# Patient Record
Sex: Male | Born: 1960 | Race: White | Hispanic: No | Marital: Married | Smoking: Never smoker
Health system: Southern US, Community
[De-identification: ages and names within clinical notes are randomized; demographics above are authoritative.]

## PROBLEM LIST (undated history)

## (undated) DIAGNOSIS — G43909 Migraine, unspecified, not intractable, without status migrainosus: Secondary | ICD-10-CM

## (undated) DIAGNOSIS — E785 Hyperlipidemia, unspecified: Secondary | ICD-10-CM

## (undated) DIAGNOSIS — R011 Cardiac murmur, unspecified: Secondary | ICD-10-CM

## (undated) DIAGNOSIS — H539 Unspecified visual disturbance: Secondary | ICD-10-CM

## (undated) DIAGNOSIS — I10 Essential (primary) hypertension: Secondary | ICD-10-CM

## (undated) DIAGNOSIS — I340 Nonrheumatic mitral (valve) insufficiency: Secondary | ICD-10-CM

## (undated) HISTORY — PX: VASECTOMY: SHX75

## (undated) HISTORY — DX: Nonrheumatic mitral (valve) insufficiency: I34.0

## (undated) HISTORY — DX: Migraine, unspecified, not intractable, without status migrainosus: G43.909

## (undated) HISTORY — DX: Hyperlipidemia, unspecified: E78.5

## (undated) HISTORY — DX: Unspecified visual disturbance: H53.9

## (undated) HISTORY — PX: HERNIA REPAIR: SHX51

## (undated) HISTORY — DX: Cardiac murmur, unspecified: R01.1

## (undated) HISTORY — DX: Essential (primary) hypertension: I10

---

## 2005-12-13 ENCOUNTER — Ambulatory Visit: Payer: Self-pay | Admitting: Cardiology

## 2005-12-18 ENCOUNTER — Encounter: Payer: Self-pay | Admitting: Cardiovascular Disease

## 2005-12-18 ENCOUNTER — Ambulatory Visit: Payer: Self-pay | Admitting: Cardiology

## 2008-07-15 ENCOUNTER — Ambulatory Visit: Payer: Self-pay | Admitting: Internal Medicine

## 2008-10-13 ENCOUNTER — Ambulatory Visit: Payer: Self-pay | Admitting: Internal Medicine

## 2009-01-13 ENCOUNTER — Ambulatory Visit: Payer: Self-pay | Admitting: Internal Medicine

## 2009-04-06 ENCOUNTER — Ambulatory Visit: Payer: Self-pay | Admitting: Internal Medicine

## 2009-07-18 ENCOUNTER — Ambulatory Visit: Payer: Self-pay | Admitting: Internal Medicine

## 2009-07-18 ENCOUNTER — Encounter (INDEPENDENT_AMBULATORY_CARE_PROVIDER_SITE_OTHER): Payer: Self-pay | Admitting: *Deleted

## 2009-08-24 ENCOUNTER — Encounter (INDEPENDENT_AMBULATORY_CARE_PROVIDER_SITE_OTHER): Payer: Self-pay | Admitting: *Deleted

## 2009-08-28 ENCOUNTER — Ambulatory Visit: Payer: Self-pay | Admitting: Internal Medicine

## 2009-09-11 ENCOUNTER — Ambulatory Visit: Payer: Self-pay | Admitting: Internal Medicine

## 2009-09-11 HISTORY — PX: COLONOSCOPY: SHX174

## 2009-09-12 LAB — HM COLONOSCOPY

## 2010-01-18 ENCOUNTER — Ambulatory Visit: Payer: Self-pay | Admitting: Internal Medicine

## 2010-03-29 ENCOUNTER — Ambulatory Visit: Payer: Self-pay | Admitting: Internal Medicine

## 2010-04-06 ENCOUNTER — Ambulatory Visit: Payer: Self-pay | Admitting: Internal Medicine

## 2010-06-26 NOTE — Letter (Signed)
Summary: Previsit letter  [facility]  [address] [place], [place] [address]   Phone: [phone]  Fax: [phone]       07/18/2009 MRN: [id number]  Joel Coffey [address] [place], [place]  [address]  Dear Joel Coffey,  Welcome to the Gastroenterology Division at [facility].    You are scheduled to see a nurse for your pre-procedure visit on 08-11-09 at 10:00a.m. on the 3rd floor at [facility], [address]  We ask that you try to arrive at our office 15 minutes prior to your appointment time to allow for check-in.  Your nurse visit will consist of discussing your medical and surgical history, your immediate family medical history, and your medications.    Please bring a complete list of all your medications or, if you prefer, bring the medication bottles and we will list them.  We will need to be aware of both prescribed and over the counter drugs.  We will need to know exact dosage information as well.  If you are on blood thinners (Coumadin, Plavix, Aggrenox, Ticlid, etc.) please call our office today/prior to your appointment, as we need to consult with your physician about holding your medication.   Please be prepared to read and sign documents such as consent forms, a financial agreement, and acknowledgement forms.  If necessary, and with your consent, a friend or relative is welcome to sit-in on the nurse visit with you.  Please bring your insurance card so that we may make a copy of it.  If your insurance requires a referral to see a specialist, please bring your referral form from your primary care physician.  No co-pay is required for this nurse visit.     If you cannot keep your appointment, please call [phone] to cancel or reschedule prior to your appointment date.  This allows [place] the opportunity to schedule an appointment for another patient in need of care.    Thank you for choosing [facility] for your medical needs.   We appreciate the opportunity to care for you.  Please visit [place] at our website  to learn more about our practice.                     Sincerely.                                                                                                                   The Gastroenterology Division

## 2010-06-26 NOTE — Procedures (Signed)
Summary: Colonoscopy  Patient: Joel Coffey Note: All result statuses are Final unless otherwise noted.  Tests: (1) Colonoscopy (COL)   COL Colonoscopy           DONE     [facility]     [address]     [place], [place]  [address]           COLONOSCOPY PROCEDURE REPORT           PATIENT:  [name], [name]  MR#:  [id number]     BIRTHDATE:  08/01/1960, 48 yrs. old  GENDER:  male     ENDOSCOPIST:  Iva Boop, MD, [facility]     REF. BY:  Sharlet Salina, M.D.     PROCEDURE DATE:  09/11/2009     PROCEDURE:  Colonoscopy 19147     ASA CLASS:  Class I     INDICATIONS:  Routine Risk Screening     MEDICATIONS:   Fentanyl 75 mcg IV, Versed 7 mg IV           DESCRIPTION OF PROCEDURE:   After the risks benefits and     alternatives of the procedure were thoroughly explained, informed     consent was obtained.  Digital rectal exam was performed and     revealed no abnormalities and normal prostate.   The LB CF-[id number]     [id number] endoscope was introduced through the anus and advanced to     the cecum, which was identified by both the appendix and ileocecal     valve, without limitations.  The quality of the prep was     excellent, using MoviPrep.  The instrument was then slowly     withdrawn as the colon was fully examined.     Insertion: 2:07 minutes Withdrawal: 9:23 minutes     <<PROCEDUREIMAGES>>           FINDINGS:  A normal appearing cecum, ileocecal valve, and     appendiceal orifice were identified. The ascending, hepatic     flexure, transverse, splenic flexure, descending, sigmoid colon,     and rectum appeared unremarkable.   Retroflexed views in the     rectum revealed no abnormalities.    The scope was then withdrawn     from the patient and the procedure completed.           COMPLICATIONS:  None           ENDOSCOPIC IMPRESSION:     1) Normal colonoscopy, excellent prep           REPEAT EXAM:  In 10 year(s) for routine screening colonocsopy.           Iva Boop, MD, [facility]           CC:  Sharlet Salina, MD     The Patient           n.     eSIGNED:   Iva Boop at 09/11/2009 02:12 PM           Cheyenne Adas, [id number]  Note: An exclamation mark (!) indicates a result that was not dispersed into the flowsheet. Document Creation Date: 09/11/2009 2:12 PM _______________________________________________________________________  (1) Order result status: Final Collection or observation date-time: 09/11/2009 14:06 Requested date-time:  Receipt date-time:  Reported date-time:  Referring Physician:   Ordering Physician: Stan Head [id number]) Specimen Source:  Source: Launa Grill Order Number: [id number] Lab site:   Appended Document: Colonoscopy  Clinical Lists Changes  Observations: Added new observation of COLONNXTDUE: 08/2019 (09/11/2009 16:20)

## 2010-06-26 NOTE — Miscellaneous (Signed)
Summary: previsit  Clinical Lists Changes  Medications: Added new medication of MOVIPREP 100 GM  SOLR (PEG-KCL-NACL-NASULF-NA ASC-C) As directed - Signed Rx of MOVIPREP 100 GM  SOLR (PEG-KCL-NACL-NASULF-NA ASC-C) As directed;  #1 x 0;  Signed;  Entered by: Clide Cliff RN;  Authorized by: Iva Boop MD, [facility];  Method used: Electronically to [facility] [phone]*, [address], [address] [place], [place]  [address], Ph: [phone], Fax: [phone] Allergies: Added new allergy or adverse reaction of AUGMENTIN Observations: Added new observation of NKA: F (08/28/2009 13:15)    Prescriptions: MOVIPREP 100 GM  SOLR (PEG-KCL-NACL-NASULF-NA ASC-C) As directed  #1 x 0   Entered by:   Clide Cliff RN   Authorized by:   Iva Boop MD, [facility]   Signed by:   Clide Cliff RN on 08/28/2009   Method used:   Electronically to        [facility] [address] [phone]* (retail)       [address]       [address]       [place], [place]  [address]       Ph: [phone]       Fax: [phone]   RxID:   [id number]

## 2010-06-26 NOTE — Letter (Signed)
Summary: [facility] Instructions  [facility]  [address] [place], [place] [address]   Phone: [phone]  Fax: [phone]       Vitaly NIXON    [birth year]    MRN: [id number]        Procedure Day /Date:  Tuesday  [birth year]     Arrival Time:  10:30 am     Procedure Time:  11:30 am     Location of Procedure:                    _X _  [facility] (4th Floor)                        PREPARATION FOR COLONOSCOPY WITH MOVIPREP   Starting 5 days prior to your procedure _4/07/2011_ do not eat nuts, seeds, popcorn, corn, beans, peas,  salads, or any raw vegetables.  Do not take any fiber supplements (e.g. Metamucil, Citrucel, and Benefiber).  THE DAY BEFORE YOUR PROCEDURE         DATE:  09/04/2009   DAY:  Monday  1.  Drink clear liquids the entire day-NO SOLID FOOD  2.  Do not drink anything colored red or purple.  Avoid juices with pulp.  No orange juice.  3.  Drink at least 64 oz. (8 glasses) of fluid/clear liquids during the day to prevent dehydration and help the prep work efficiently.  CLEAR LIQUIDS INCLUDE: Water Jello Ice Popsicles Tea (sugar ok, no milk/cream) Powdered fruit flavored drinks Coffee (sugar ok, no milk/cream) Gatorade Juice: apple, white grape, white cranberry  Lemonade Clear bullion, consomm, broth Carbonated beverages (any kind) Strained chicken noodle soup Hard Candy                             4.  In the morning, mix first dose of MoviPrep solution:    Empty 1 Pouch A and 1 Pouch B into the disposable container    Add lukewarm drinking water to the top line of the container. Mix to dissolve    Refrigerate (mixed solution should be used within 24 hrs)  5.  Begin drinking the prep at 5:00 p.m. The MoviPrep container is divided by 4 marks.   Every 15 minutes drink the solution down to the next mark (approximately 8 oz) until the full liter is complete.   6.  Follow completed prep with 16 oz of clear liquid of your  choice (Nothing red or purple).  Continue to drink clear liquids until bedtime.  7.  Before going to bed, mix second dose of MoviPrep solution:    Empty 1 Pouch A and 1 Pouch B into the disposable container    Add lukewarm drinking water to the top line of the container. Mix to dissolve    Refrigerate  THE DAY OF YOUR PROCEDURE      DATE: _ 09/05/2009 _ DAY: _ Tuesday  Beginning at _ 6:30 _a.m. (5 hours before procedure):         1. Every 15 minutes, drink the solution down to the next mark (52 oz) until the full liter is complete.  2. Follow completed prep with 16 oz. of clear liquid of your choice.    3. You may drink clear liquids until _ 9:30 am _ (2 HOURS BEFORE PROCEDURE).   MEDICATION INSTRUCTIONS  Unless otherwise instructed, you should take regular prescription medications with a  small sip of water   as early as possible the morning of your procedure.           OTHER INSTRUCTIONS  You will need a responsible adult at least 52 years of age to accompany you and drive you home.   This person must remain in the waiting room during your procedure.  Wear loose fitting clothing that is easily removed.  Leave jewelry and other valuables at home.  However, you may wish to bring a book to read or  an iPod/MP3 player to listen to music as you wait for your procedure to start.  Remove all body piercing jewelry and leave at home.  Total time from sign-in until discharge is approximately 2-3 hours.  You should go home directly after your procedure and rest.  You can resume normal activities the  day after your procedure.  The day of your procedure you should not:   Drive   Make legal decisions   Operate machinery   Drink alcohol   Return to work  You will receive specific instructions about eating, activities and medications before you leave.    The above instructions have been reviewed and explained to me by   Clide Cliff,  RN_____________________    I fully understand and can verbalize these instructions _____________________________ Date _________

## 2010-07-24 ENCOUNTER — Other Ambulatory Visit: Payer: Self-pay | Admitting: Internal Medicine

## 2010-07-26 ENCOUNTER — Encounter (INDEPENDENT_AMBULATORY_CARE_PROVIDER_SITE_OTHER): Payer: Self-pay | Admitting: Internal Medicine

## 2010-07-26 DIAGNOSIS — E785 Hyperlipidemia, unspecified: Secondary | ICD-10-CM

## 2010-07-26 DIAGNOSIS — J309 Allergic rhinitis, unspecified: Secondary | ICD-10-CM

## 2010-07-26 DIAGNOSIS — E559 Vitamin D deficiency, unspecified: Secondary | ICD-10-CM

## 2010-08-02 ENCOUNTER — Ambulatory Visit
Admission: RE | Admit: 2010-08-02 | Discharge: 2010-08-02 | Disposition: A | Discharge status: Home / Self Care | Payer: BC Managed Care – PPO | Source: Ambulatory Visit | Attending: Internal Medicine | Admitting: Internal Medicine

## 2010-08-02 ENCOUNTER — Other Ambulatory Visit: Payer: Self-pay | Admitting: Internal Medicine

## 2010-10-12 NOTE — Assessment & Plan Note (Signed)
[facility]                              CARDIOLOGY OFFICE NOTE   Joel Coffey, Joel Coffey                          MRN:          [id number]  DATE:12/13/2005                            DOB:          March 13, 1961    I was asked by Dr. Lenord Fellers to evaluate Joel Coffey, a delightful 50 year old  married white male, father of two, for a history of mitral valve  regurgitation.   Apparently, his murmur intensity has changed.  He is totally asymptomatic  and runs about 12-15 miles a week.  He denies any shortness of breath,  palpitations, presyncope, syncope, or chest pain.   A 2D echocardiogram in [place], [place] showed mild mitral  regurgitation and mild pulmonary regurgitation.  There was no mention of  mitral valve disease or dysfunction otherwise.   Recent blood work was within normal limits.  His total cholesterol was 202,  HDL 56, LDL 133, triglycerides 67.   PAST MEDICAL HISTORY:  No known drug allergies.  He is on no meds.  He does  not smoke.  Drink:  He has about 1-2 alcoholic beverages a day.  He runs  about four times a week.   PAST SURGICAL HISTORY:  Hernia repair in 1985.   FAMILY HISTORY:  No premature history of coronary disease.   SOCIAL HISTORY:  He is a Programme researcher, broadcasting/film/video.  He is married and has two  children.   REVIEW OF SYSTEMS:  Negative other than the history of present illness.   PHYSICAL EXAMINATION:  VITAL SIGNS:  Blood pressure 115/72, pulse 64 and  regular.  His weight is 172.  He is 6 feet tall.  GENERAL:  Very pleasant.  SKIN:  Slightly pale.  HEENT:  Sclerae are clear.  Extraocular movements are intact.  PERRLA.  Facial asymmetry is normal.  Dentition satisfactory.  NECK:  Carotids are full without bruits.  There is no JVD.  Thyroid is not  enlarged.  Trachea is midline.  LUNGS:  Clear.  HEART:  Regular rate and rhythm without murmur, rub or gallop.  ABDOMEN:  Soft with good bowel sounds.  There is no  hepatomegaly.  EXTREMITIES:  No clubbing, cyanosis or edema.  Pulses are intact.  NEUROLOGIC:  Intact.   Careful auscultation of his heart revealed a soft murmur.  I could not  appreciate a click or any prolapse.  There was no S3 gallop.  There was no  S4.   ASSESSMENT:  Soft systolic murmur with a history of mild mitral  regurgitation and mild pulmonary regurgitation on a 2D echocardiogram in  1998.  He is totally asymptomatic.  I am not that impressed with his exam.  I suspect it has not changed.   PLAN:  1.  A 2D echocardiogram to reassess objectively the degree of regurgitation.  2.  I reviewed with the patient recent guidelines that do not recommend      endocarditis prophylaxis for mitral regurgitation alone.  Thomas C. Daleen Squibb, MD, [facility]    TCW/MedQ  DD:  12/13/2005  DT:  12/13/2005  Job #:  [id number]   cc:   Luanna Cole. Lenord Fellers, MD

## 2010-12-22 ENCOUNTER — Other Ambulatory Visit: Payer: Self-pay | Admitting: Internal Medicine

## 2011-02-07 ENCOUNTER — Ambulatory Visit: Payer: BC Managed Care – PPO | Admitting: Internal Medicine

## 2011-03-04 ENCOUNTER — Ambulatory Visit (INDEPENDENT_AMBULATORY_CARE_PROVIDER_SITE_OTHER): Payer: BC Managed Care – PPO | Admitting: Internal Medicine

## 2011-03-04 ENCOUNTER — Encounter: Payer: Self-pay | Admitting: Internal Medicine

## 2011-03-04 VITALS — BP 116/80 | HR 68 | Temp 98.3°F | Ht 73.0 in | Wt 203.0 lb

## 2011-03-04 DIAGNOSIS — E785 Hyperlipidemia, unspecified: Secondary | ICD-10-CM | POA: Insufficient documentation

## 2011-03-04 DIAGNOSIS — I340 Nonrheumatic mitral (valve) insufficiency: Secondary | ICD-10-CM

## 2011-03-04 DIAGNOSIS — E569 Vitamin deficiency, unspecified: Secondary | ICD-10-CM

## 2011-03-04 DIAGNOSIS — Z23 Encounter for immunization: Secondary | ICD-10-CM

## 2011-03-04 DIAGNOSIS — G43109 Migraine with aura, not intractable, without status migrainosus: Secondary | ICD-10-CM

## 2011-03-04 DIAGNOSIS — J309 Allergic rhinitis, unspecified: Secondary | ICD-10-CM | POA: Insufficient documentation

## 2011-03-04 DIAGNOSIS — E559 Vitamin D deficiency, unspecified: Secondary | ICD-10-CM | POA: Insufficient documentation

## 2011-03-04 DIAGNOSIS — I059 Rheumatic mitral valve disease, unspecified: Secondary | ICD-10-CM

## 2011-03-04 LAB — LIPID PANEL
Cholesterol: 197 mg/dL (ref 0–200)
HDL: 60 mg/dL (ref >39)
LDL Cholesterol: 112 mg/dL — ABNORMAL HIGH (ref 0–99)
Total CHOL/HDL Ratio: 3.3 Ratio
Triglycerides: 125 mg/dL (ref <150)
VLDL: 25 mg/dL (ref 0–40)

## 2011-03-04 LAB — HEPATIC FUNCTION PANEL
ALT: 60 U/L — ABNORMAL HIGH (ref 0–53)
AST: 43 U/L — ABNORMAL HIGH (ref 0–37)
Albumin: 4.6 g/dL (ref 3.5–5.2)
Alkaline Phosphatase: 88 U/L (ref 39–117)
Bilirubin, Direct: 0.2 mg/dL (ref 0.0–0.3)
Indirect Bilirubin: 0.4 mg/dL (ref 0.0–0.9)
Total Bilirubin: 0.6 mg/dL (ref 0.3–1.2)
Total Protein: 6.9 g/dL (ref 6.0–8.3)

## 2011-03-04 NOTE — Progress Notes (Signed)
  Subjective:    Patient ID: Joel Coffey, male    DOB: 1960-10-21, 50 y.o.   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 Lipid panel, liver functions, vitamin D level drawn and are pending. History of right tibia fracture around 50 years of age. History of right inguinal hernia in 1982. Vasectomy 2002. Tetanus immunization 2007. Colonoscopy April 2011. He is on generic Zocor 10 mg daily since May 2010.     Review of Systems     Objective:   Physical Exam neck no JVD thyromegaly or carotid bruits; chest clear to auscultation; cardiac exam regular rate and rhythm 2/6 short systolic ejection murmur heard along the left sternal border. Had 2-D echocardiogram @[facility]  in 2007 showing trivial mitral regurgitation.        Assessment & Plan:  Hyperlipidemia  Trivial mitral regurgitation  Allergic rhinitis  History of vitamin D deficiency  Plan is to review lab work lipid panel liver functions and vitamin D level. He will return in 6 months for physical exam. Influenza immunization administered today

## 2011-03-05 LAB — VITAMIN D 25 HYDROXY (VIT D DEFICIENCY, FRACTURES): Vit D, 25-Hydroxy: 44 ng/mL (ref 30–89)

## 2011-04-25 ENCOUNTER — Other Ambulatory Visit: Payer: Self-pay | Admitting: Internal Medicine

## 2011-07-15 ENCOUNTER — Other Ambulatory Visit: Payer: Self-pay | Admitting: Internal Medicine

## 2011-09-05 ENCOUNTER — Other Ambulatory Visit: Payer: BC Managed Care – PPO | Admitting: Internal Medicine

## 2011-09-05 DIAGNOSIS — Z0000 Encounter for general adult medical examination without abnormal findings: Secondary | ICD-10-CM

## 2011-09-05 LAB — COMPREHENSIVE METABOLIC PANEL
ALT: 34 U/L (ref 0–53)
AST: 33 U/L (ref 0–37)
Albumin: 4.3 g/dL (ref 3.5–5.2)
Alkaline Phosphatase: 73 U/L (ref 39–117)
BUN: 13 mg/dL (ref 6–23)
CO2: 28 mEq/L (ref 19–32)
Calcium: 9.3 mg/dL (ref 8.4–10.5)
Chloride: 104 mEq/L (ref 96–112)
Creat: 0.84 mg/dL (ref 0.50–1.35)
Glucose, Bld: 85 mg/dL (ref 70–99)
Potassium: 4.1 mEq/L (ref 3.5–5.3)
Sodium: 140 mEq/L (ref 135–145)
Total Bilirubin: 0.7 mg/dL (ref 0.3–1.2)
Total Protein: 6.9 g/dL (ref 6.0–8.3)

## 2011-09-05 LAB — CBC WITH DIFFERENTIAL/PLATELET
Basophils Absolute: 0.1 10*3/uL (ref 0.0–0.1)
Basophils Relative: 1 % (ref 0–1)
Eosinophils Absolute: 0.3 10*3/uL (ref 0.0–0.7)
Eosinophils Relative: 4 % (ref 0–5)
HCT: 45 % (ref 39.0–52.0)
Hemoglobin: 15.6 g/dL (ref 13.0–17.0)
Lymphocytes Relative: 26 % (ref 12–46)
Lymphs Abs: 1.7 10*3/uL (ref 0.7–4.0)
MCH: 30.2 pg (ref 26.0–34.0)
MCHC: 34.7 g/dL (ref 30.0–36.0)
MCV: 87.2 fL (ref 78.0–100.0)
Monocytes Absolute: 0.5 10*3/uL (ref 0.1–1.0)
Monocytes Relative: 8 % (ref 3–12)
Neutro Abs: 4 10*3/uL (ref 1.7–7.7)
Neutrophils Relative %: 61 % (ref 43–77)
Platelets: 208 10*3/uL (ref 150–400)
RBC: 5.16 MIL/uL (ref 4.22–5.81)
RDW: 12.8 % (ref 11.5–15.5)
WBC: 6.6 10*3/uL (ref 4.0–10.5)

## 2011-09-05 LAB — LIPID PANEL
Cholesterol: 194 mg/dL (ref 0–200)
HDL: 64 mg/dL (ref >39)
LDL Cholesterol: 117 mg/dL — ABNORMAL HIGH (ref 0–99)
Total CHOL/HDL Ratio: 3 Ratio
Triglycerides: 65 mg/dL (ref <150)
VLDL: 13 mg/dL (ref 0–40)

## 2011-09-06 ENCOUNTER — Encounter: Payer: Self-pay | Admitting: Internal Medicine

## 2011-09-06 ENCOUNTER — Ambulatory Visit (INDEPENDENT_AMBULATORY_CARE_PROVIDER_SITE_OTHER): Payer: BC Managed Care – PPO | Admitting: Internal Medicine

## 2011-09-06 VITALS — BP 118/86 | HR 68 | Temp 97.8°F | Ht 72.0 in | Wt 201.0 lb

## 2011-09-06 DIAGNOSIS — E785 Hyperlipidemia, unspecified: Secondary | ICD-10-CM

## 2011-09-06 DIAGNOSIS — I340 Nonrheumatic mitral (valve) insufficiency: Secondary | ICD-10-CM

## 2011-09-06 DIAGNOSIS — G43109 Migraine with aura, not intractable, without status migrainosus: Secondary | ICD-10-CM

## 2011-09-06 DIAGNOSIS — I059 Rheumatic mitral valve disease, unspecified: Secondary | ICD-10-CM

## 2011-09-06 DIAGNOSIS — Z0000 Encounter for general adult medical examination without abnormal findings: Secondary | ICD-10-CM

## 2011-09-06 DIAGNOSIS — J309 Allergic rhinitis, unspecified: Secondary | ICD-10-CM

## 2011-09-06 LAB — POCT URINALYSIS DIPSTICK
Bilirubin, UA: NEGATIVE
Blood, UA: NEGATIVE
Glucose, UA: NEGATIVE
Ketones, UA: NEGATIVE
Leukocytes, UA: NEGATIVE
Nitrite, UA: NEGATIVE
Protein, UA: NEGATIVE
Spec Grav, UA: 1.01
Urobilinogen, UA: NEGATIVE
pH, UA: 7

## 2011-09-06 LAB — PSA: PSA: 0.49 ng/mL (ref <=4.00)

## 2011-09-23 ENCOUNTER — Encounter: Payer: Self-pay | Admitting: Internal Medicine

## 2011-09-23 NOTE — Patient Instructions (Signed)
Return in 6 months for office visit lipid panel, liver functions, and influenza immunization. Continue same medications.

## 2011-09-23 NOTE — Progress Notes (Signed)
  Subjective:    Patient ID: Joel Coffey, male    DOB: 07/10/1960, 51 y.o.   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 History of mitral regurgitation asymptomatic. History of classical migraine headaches. History of vitamin D deficiency and hyperlipidemia. He is on Zocor generic 10 mg daily since 2010. Had colonoscopy April 2011. He fractured his right tibia when he was less than 76 years old. He is allergic to Augmentin causes a rash. History of right inguinal hernia repair 1982. Vasectomy 2002. History of allergic rhinitis. Allergy testing done 2009 showed very strong reactivity to multiple grass and tree pollens. Mild reactivity to selected molds, dust mite, cat dander and cockroach.    Review of Systems  Constitutional: Negative.   HENT: Positive for congestion and rhinorrhea.   Eyes: Negative.   Respiratory: Negative.   Cardiovascular:       History of asymptomatic mitral regurgitation seen by cardiologist  Genitourinary: Negative.   Musculoskeletal: Negative.   Neurological:       History of classical migraine headache  Hematological: Negative.   Psychiatric/Behavioral: Negative.        Objective:   Physical Exam  Vitals reviewed. Constitutional: He is oriented to person, place, and time. He appears well-developed and well-nourished.  HENT:  Head: Normocephalic and atraumatic.  Right Ear: External ear normal.  Left Ear: External ear normal.  Nose: Nose normal.  Mouth/Throat: Oropharynx is clear and moist.  Eyes: Conjunctivae and EOM are normal. Pupils are equal, round, and reactive to light.  Neck: Neck supple. No JVD present. No thyromegaly present.  Cardiovascular: Normal rate, regular rhythm and intact distal pulses.   Murmur heard.      2-3/6 systolic ejection murmur heard along the left sternal border  Pulmonary/Chest: Effort normal and breath sounds normal. He has no  wheezes. He has no rales.  Abdominal: Soft. Bowel sounds are normal. He exhibits no distension and no mass. There is no tenderness. There is no rebound and no guarding.  Genitourinary: Prostate normal.  Musculoskeletal: Normal range of motion. He exhibits no edema.  Lymphadenopathy:    He has no cervical adenopathy.  Neurological: He is alert and oriented to person, place, and time. He has normal reflexes. No cranial nerve deficit. Coordination normal.  Skin: Skin is warm and dry. No rash noted.  Psychiatric: He has a normal mood and affect. His behavior is normal. Judgment and thought content normal.          Assessment & Plan:  Hyperlipidemia  Asymptomatic mitral regurgitation  History of classical migraine headache  History of vitamin D deficiency  Allergic rhinitis  Plan: Patient is to return in 6 months for office visit lipid panel liver functions. That time he'll get influenza immunization. Lab work reviewed with him today is within normal limits with the exception of LDL cholesterol of 117. Total cholesterol is 194, HDL cholesterol is excellent at 64. Triglycerides are normal at 65.

## 2011-09-24 ENCOUNTER — Other Ambulatory Visit: Payer: Self-pay | Admitting: Internal Medicine

## 2011-11-06 ENCOUNTER — Other Ambulatory Visit: Payer: Self-pay | Admitting: Internal Medicine

## 2012-02-24 ENCOUNTER — Other Ambulatory Visit: Payer: Self-pay

## 2012-02-24 MED ORDER — IBUPROFEN 800 MG PO TABS: 800.0000 mg | ORAL_TABLET | Freq: Three times a day (TID) | ORAL | Status: DC | PRN

## 2012-03-09 ENCOUNTER — Other Ambulatory Visit: Payer: BC Managed Care – PPO | Admitting: Internal Medicine

## 2012-03-09 DIAGNOSIS — Z79899 Other long term (current) drug therapy: Secondary | ICD-10-CM

## 2012-03-09 DIAGNOSIS — E785 Hyperlipidemia, unspecified: Secondary | ICD-10-CM

## 2012-03-09 LAB — HEPATIC FUNCTION PANEL
ALT: 42 U/L (ref 0–53)
AST: 33 U/L (ref 0–37)
Albumin: 4.4 g/dL (ref 3.5–5.2)
Alkaline Phosphatase: 74 U/L (ref 39–117)
Bilirubin, Direct: 0.2 mg/dL (ref 0.0–0.3)
Indirect Bilirubin: 0.6 mg/dL (ref 0.0–0.9)
Total Bilirubin: 0.8 mg/dL (ref 0.3–1.2)
Total Protein: 6.8 g/dL (ref 6.0–8.3)

## 2012-03-09 LAB — LIPID PANEL
Cholesterol: 202 mg/dL — ABNORMAL HIGH (ref 0–200)
HDL: 60 mg/dL (ref >39)
LDL Cholesterol: 127 mg/dL — ABNORMAL HIGH (ref 0–99)
Total CHOL/HDL Ratio: 3.4 Ratio
Triglycerides: 73 mg/dL (ref <150)
VLDL: 15 mg/dL (ref 0–40)

## 2012-03-10 ENCOUNTER — Ambulatory Visit (INDEPENDENT_AMBULATORY_CARE_PROVIDER_SITE_OTHER): Payer: BC Managed Care – PPO | Admitting: Internal Medicine

## 2012-03-10 ENCOUNTER — Encounter: Payer: Self-pay | Admitting: Internal Medicine

## 2012-03-10 VITALS — BP 120/84 | HR 80 | Temp 97.8°F | Wt 196.0 lb

## 2012-03-10 DIAGNOSIS — Z23 Encounter for immunization: Secondary | ICD-10-CM

## 2012-03-10 DIAGNOSIS — E785 Hyperlipidemia, unspecified: Secondary | ICD-10-CM

## 2012-03-10 DIAGNOSIS — J309 Allergic rhinitis, unspecified: Secondary | ICD-10-CM

## 2012-03-10 NOTE — Progress Notes (Signed)
  Subjective:    Patient ID: Joel Coffey, male    DOB: December 12, 1960, 51 y.o.   MRN: [id number]  HPI 51 year old white male in today to followup on hyperlipidemia and allergic rhinitis. Allergy symptoms have been stable and has had no recent exacerbations this fall. Remains on statin medication. Reviewed lipid panel liver functions with him which are acceptable. Influenza immunization given.     Review of Systems     Objective:   Physical Exam skin is warm and dry. HEENT exam: TMs and pharynx are clear. Chest is clear. Cardiac exam regular rate and rhythm normal S1 and S2 murmur unchanged. Extremities without edema.        Assessment & Plan:  Hyperlipidemia  Allergic rhinitis  Plan: Influenza immunization given. Okay to refill statin medication for 6 months if pharmacy calls. Return in 6 months i.e. mid April for physical examination.

## 2012-03-10 NOTE — Patient Instructions (Addendum)
Physical exam due after 09/05/2012. Continue same medications and return at that time.

## 2012-09-07 ENCOUNTER — Other Ambulatory Visit: Payer: BC Managed Care – PPO | Admitting: Internal Medicine

## 2012-09-07 DIAGNOSIS — Z0000 Encounter for general adult medical examination without abnormal findings: Secondary | ICD-10-CM

## 2012-09-07 DIAGNOSIS — Z125 Encounter for screening for malignant neoplasm of prostate: Secondary | ICD-10-CM

## 2012-09-07 LAB — CBC WITH DIFFERENTIAL/PLATELET
Basophils Absolute: 0 10*3/uL (ref 0.0–0.1)
Basophils Relative: 1 % (ref 0–1)
Eosinophils Absolute: 0.2 10*3/uL (ref 0.0–0.7)
Eosinophils Relative: 3 % (ref 0–5)
HCT: 43.3 % (ref 39.0–52.0)
Hemoglobin: 15.3 g/dL (ref 13.0–17.0)
Lymphocytes Relative: 28 % (ref 12–46)
Lymphs Abs: 1.7 10*3/uL (ref 0.7–4.0)
MCH: 30.2 pg (ref 26.0–34.0)
MCHC: 35.3 g/dL (ref 30.0–36.0)
MCV: 85.4 fL (ref 78.0–100.0)
Monocytes Absolute: 0.5 10*3/uL (ref 0.1–1.0)
Monocytes Relative: 8 % (ref 3–12)
Neutro Abs: 3.7 10*3/uL (ref 1.7–7.7)
Neutrophils Relative %: 60 % (ref 43–77)
Platelets: 207 10*3/uL (ref 150–400)
RBC: 5.07 MIL/uL (ref 4.22–5.81)
RDW: 13.6 % (ref 11.5–15.5)
WBC: 6.1 10*3/uL (ref 4.0–10.5)

## 2012-09-07 LAB — COMPREHENSIVE METABOLIC PANEL
ALT: 33 U/L (ref 0–53)
AST: 35 U/L (ref 0–37)
Albumin: 4.3 g/dL (ref 3.5–5.2)
Alkaline Phosphatase: 67 U/L (ref 39–117)
BUN: 11 mg/dL (ref 6–23)
CO2: 26 mEq/L (ref 19–32)
Calcium: 9.3 mg/dL (ref 8.4–10.5)
Chloride: 103 mEq/L (ref 96–112)
Creat: 0.86 mg/dL (ref 0.50–1.35)
Glucose, Bld: 88 mg/dL (ref 70–99)
Potassium: 4.1 mEq/L (ref 3.5–5.3)
Sodium: 139 mEq/L (ref 135–145)
Total Bilirubin: 0.8 mg/dL (ref 0.3–1.2)
Total Protein: 6.8 g/dL (ref 6.0–8.3)

## 2012-09-07 LAB — LIPID PANEL
Cholesterol: 214 mg/dL — ABNORMAL HIGH (ref 0–200)
HDL: 65 mg/dL (ref >39)
LDL Cholesterol: 131 mg/dL — ABNORMAL HIGH (ref 0–99)
Total CHOL/HDL Ratio: 3.3 Ratio
Triglycerides: 88 mg/dL (ref <150)
VLDL: 18 mg/dL (ref 0–40)

## 2012-09-07 LAB — PSA: PSA: 0.71 ng/mL (ref <=4.00)

## 2012-09-08 ENCOUNTER — Encounter: Payer: Self-pay | Admitting: Internal Medicine

## 2012-09-08 ENCOUNTER — Ambulatory Visit (INDEPENDENT_AMBULATORY_CARE_PROVIDER_SITE_OTHER): Payer: BC Managed Care – PPO | Admitting: Internal Medicine

## 2012-09-08 VITALS — BP 132/88 | HR 68 | Ht 72.25 in | Wt 201.0 lb

## 2012-09-08 DIAGNOSIS — Z0000 Encounter for general adult medical examination without abnormal findings: Secondary | ICD-10-CM

## 2012-09-08 DIAGNOSIS — Z8669 Personal history of other diseases of the nervous system and sense organs: Secondary | ICD-10-CM

## 2012-09-08 DIAGNOSIS — I059 Rheumatic mitral valve disease, unspecified: Secondary | ICD-10-CM

## 2012-09-08 DIAGNOSIS — J309 Allergic rhinitis, unspecified: Secondary | ICD-10-CM

## 2012-09-08 DIAGNOSIS — I340 Nonrheumatic mitral (valve) insufficiency: Secondary | ICD-10-CM

## 2012-09-08 DIAGNOSIS — E785 Hyperlipidemia, unspecified: Secondary | ICD-10-CM

## 2012-09-08 LAB — POCT URINALYSIS DIPSTICK
Bilirubin, UA: NEGATIVE
Blood, UA: NEGATIVE
Glucose, UA: NEGATIVE
Ketones, UA: NEGATIVE
Leukocytes, UA: NEGATIVE
Nitrite, UA: NEGATIVE
Protein, UA: NEGATIVE
Spec Grav, UA: 1.015
Urobilinogen, UA: NEGATIVE
pH, UA: 5.5

## 2012-10-21 ENCOUNTER — Other Ambulatory Visit: Payer: Self-pay | Admitting: Internal Medicine

## 2013-01-18 ENCOUNTER — Other Ambulatory Visit: Payer: Self-pay | Admitting: Internal Medicine

## 2013-02-24 ENCOUNTER — Encounter: Payer: Self-pay | Admitting: Internal Medicine

## 2013-02-24 NOTE — Patient Instructions (Addendum)
Continued lipid-lowering medication and return in 6 months

## 2013-02-24 NOTE — Progress Notes (Signed)
Subjective:    Patient ID: Joel Coffey, male    DOB: [birth year], 52 y.o.   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 History of asymptomatic mitral regurgitation. History of classical migraine headaches. History of hyperlipidemia and vitamin D deficiency. He has been on Zocor generic 10 mg daily since 2010.  Past medical history: Had colonoscopy April 2011. Fractured his right tibia when he was less than 31 years old. History of right inguinal hernia repair 1982. Vasectomy 2002. History of allergic rhinitis. Allergy testing done in 2009 showed very strong reactivity to multiple grasses and tree pollens.. He had mild reactivity to selected molds, dust mite, cat dander and cockroach.  He apparently is not allergic to Augmentin. He went to allergist in [place] and had formal testing for penicillin allergy. Was found not to have penicillin allergy. Therefore he has no known drug allergies.  Old records indicate he had normal lab work in 2004 with the exception of an LDL cholesterol of 129 and a total cholesterol of 205. This was during an insurance examination.  He had an episode of scrotal pain in June 2004 and was treated for presumed epididymitis.  There are old records indicating he had possible irritable bowel syndrome treated with new they have.  His cardiac murmur dates back to the late 1990s. His doctor in [place] where he resided before moving to [place] noted in 2005 that she felt there have been no change in his cardiac murmur. However in 2006 she felt this murmur had changed a bit and was more intense. He has been told he does not need SBE prophylaxis. He was evaluated here by cardiologist 2007. Dr. Golden Hurter saw him and ordered a 2-D echocardiogram showing trivial mitral valvular regurgitation. No evidence of mitral stenosis or mitral valve prolapse. Overall left ventricular  systolic function was normal.  Social history: He is married and has 2 children a son and a daughter. Daughter is a Chief Strategy Officer in high school. Wife is a Sports administrator at [employer].  Family history:     Review of Systems  Constitutional: Negative.   HENT: Negative.   Eyes: Negative.   Respiratory: Negative.   Cardiovascular: Negative.   Gastrointestinal: Negative.   Endocrine: Negative.   Allergic/Immunologic: Positive for environmental allergies.  Hematological: Negative.   Psychiatric/Behavioral: Negative.        Objective:   Physical Exam  Constitutional: He is oriented to person, place, and time. He appears well-developed and well-nourished. No distress.  HENT:  Head: Normocephalic and atraumatic.  Right Ear: External ear normal.  Left Ear: External ear normal.  Mouth/Throat: Oropharynx is clear and moist. No oropharyngeal exudate.  Eyes: Conjunctivae and EOM are normal. Pupils are equal, round, and reactive to light. Right eye exhibits no discharge. Left eye exhibits no discharge. No scleral icterus.  Neck: Neck supple. No JVD present. No thyromegaly present.  Cardiovascular: Normal rate, regular rhythm and intact distal pulses.   Murmur heard. 2-3/6 systolic ejection murmur heard along left sternal border  Pulmonary/Chest: Effort normal and breath sounds normal. He has no wheezes. He has no rales.  Abdominal: Soft. Bowel sounds are normal. He exhibits no distension and no mass. There is no tenderness. There is no rebound and no guarding.  Genitourinary: Prostate normal.  Musculoskeletal: Normal range of motion. He exhibits no edema.  Lymphadenopathy:    He has no cervical adenopathy.  Neurological: He is alert and oriented to person, place, and time. He has normal reflexes. No cranial nerve deficit. Coordination normal.  Skin: Skin is warm and dry. No rash noted. He is not diaphoretic. No erythema.  Psychiatric: He has a normal mood and affect. His  behavior is normal. Judgment and thought content normal.          Assessment & Plan:  Hyperlipidemia-stable  Asymptomatic mitral regurgitation-unchanged  History of classical migraine headache  History of vitamin D deficiency  Allergic rhinitis-stable  Plan: Patient is to return in 6 months for office visit, lipid panel liver functions. At that time he'll get influenza immunization.

## 2013-03-11 ENCOUNTER — Other Ambulatory Visit: Payer: Self-pay | Admitting: Internal Medicine

## 2013-03-11 ENCOUNTER — Other Ambulatory Visit: Payer: BC Managed Care – PPO | Admitting: Internal Medicine

## 2013-03-11 DIAGNOSIS — E785 Hyperlipidemia, unspecified: Secondary | ICD-10-CM

## 2013-03-11 LAB — LIPID PANEL
Cholesterol: 165 mg/dL (ref 0–200)
HDL: 61 mg/dL (ref >39)
LDL Cholesterol: 90 mg/dL (ref 0–99)
Total CHOL/HDL Ratio: 2.7 Ratio
Triglycerides: 69 mg/dL (ref <150)
VLDL: 14 mg/dL (ref 0–40)

## 2013-03-12 ENCOUNTER — Ambulatory Visit: Payer: BC Managed Care – PPO | Admitting: Internal Medicine

## 2013-03-16 ENCOUNTER — Ambulatory Visit (INDEPENDENT_AMBULATORY_CARE_PROVIDER_SITE_OTHER): Payer: BC Managed Care – PPO | Admitting: Internal Medicine

## 2013-03-16 ENCOUNTER — Encounter: Payer: Self-pay | Admitting: Internal Medicine

## 2013-03-16 VITALS — BP 138/90 | HR 64 | Temp 97.7°F | Ht 72.0 in | Wt 200.0 lb

## 2013-03-16 DIAGNOSIS — Z23 Encounter for immunization: Secondary | ICD-10-CM

## 2013-03-16 DIAGNOSIS — E785 Hyperlipidemia, unspecified: Secondary | ICD-10-CM

## 2013-03-16 LAB — HEPATIC FUNCTION PANEL
ALT: 23 U/L (ref 0–53)
AST: 26 U/L (ref 0–37)
Albumin: 4.2 g/dL (ref 3.5–5.2)
Alkaline Phosphatase: 62 U/L (ref 39–117)
Bilirubin, Direct: 0.1 mg/dL (ref 0.0–0.3)
Indirect Bilirubin: 0.4 mg/dL (ref 0.0–0.9)
Total Bilirubin: 0.5 mg/dL (ref 0.3–1.2)
Total Protein: 6.7 g/dL (ref 6.0–8.3)

## 2013-03-16 NOTE — Progress Notes (Signed)
  Subjective:    Patient ID: Joel Coffey, male    DOB: 03/29/1961, 52 y.o.   MRN: [id number]  HPI 52 year old white male in today for six-month recheck on hyperlipidemia. He is on low-dose Zocor. No complaints or problems. Flu vaccine given today. Lipid panel is within normal limits on low-dose Zocor. His wife has been diagnosed with breast cancer and is undergoing chemotherapy. We talked about this today. It's been a bit stressful.    Review of Systems     Objective:   Physical Exam Chest clear to auscultation. Cardiac exam regular rate and rhythm. Murmur is unchanged. Extremities without edema. Skin is warm and dry.        Assessment & Plan:  Hyperlipidemia-stable on low-dose Zocor  Plan: Return in 6 months for physical exam. Flu vaccine given.

## 2013-03-16 NOTE — Patient Instructions (Addendum)
Continue same medication and return in 6 months for physical exam.

## 2013-05-13 ENCOUNTER — Other Ambulatory Visit: Payer: Self-pay | Admitting: Internal Medicine

## 2013-09-09 ENCOUNTER — Other Ambulatory Visit: Payer: BC Managed Care – PPO | Admitting: Internal Medicine

## 2013-09-09 DIAGNOSIS — E785 Hyperlipidemia, unspecified: Secondary | ICD-10-CM

## 2013-09-09 DIAGNOSIS — Z130 Encounter for screening for diseases of the blood and blood-forming organs and certain disorders involving the immune mechanism: Secondary | ICD-10-CM

## 2013-09-09 DIAGNOSIS — Z79899 Other long term (current) drug therapy: Secondary | ICD-10-CM

## 2013-09-09 DIAGNOSIS — Z125 Encounter for screening for malignant neoplasm of prostate: Secondary | ICD-10-CM

## 2013-09-09 LAB — CBC WITH DIFFERENTIAL/PLATELET
Basophils Absolute: 0.1 10*3/uL (ref 0.0–0.1)
Basophils Relative: 1 % (ref 0–1)
Eosinophils Absolute: 0.2 10*3/uL (ref 0.0–0.7)
Eosinophils Relative: 3 % (ref 0–5)
HCT: 43.3 % (ref 39.0–52.0)
Hemoglobin: 15.3 g/dL (ref 13.0–17.0)
Lymphocytes Relative: 26 % (ref 12–46)
Lymphs Abs: 1.5 10*3/uL (ref 0.7–4.0)
MCH: 31.1 pg (ref 26.0–34.0)
MCHC: 35.3 g/dL (ref 30.0–36.0)
MCV: 88 fL (ref 78.0–100.0)
Monocytes Absolute: 0.5 10*3/uL (ref 0.1–1.0)
Monocytes Relative: 8 % (ref 3–12)
Neutro Abs: 3.7 10*3/uL (ref 1.7–7.7)
Neutrophils Relative %: 62 % (ref 43–77)
Platelets: 206 10*3/uL (ref 150–400)
RBC: 4.92 MIL/uL (ref 4.22–5.81)
RDW: 13.3 % (ref 11.5–15.5)
WBC: 5.9 10*3/uL (ref 4.0–10.5)

## 2013-09-09 LAB — COMPREHENSIVE METABOLIC PANEL
ALT: 30 U/L (ref 0–53)
AST: 30 U/L (ref 0–37)
Albumin: 4.2 g/dL (ref 3.5–5.2)
Alkaline Phosphatase: 64 U/L (ref 39–117)
BUN: 11 mg/dL (ref 6–23)
CO2: 30 mEq/L (ref 19–32)
Calcium: 9.1 mg/dL (ref 8.4–10.5)
Chloride: 101 mEq/L (ref 96–112)
Creat: 0.76 mg/dL (ref 0.50–1.35)
Glucose, Bld: 95 mg/dL (ref 70–99)
Potassium: 4.3 mEq/L (ref 3.5–5.3)
Sodium: 135 mEq/L (ref 135–145)
Total Bilirubin: 0.7 mg/dL (ref 0.2–1.2)
Total Protein: 6.8 g/dL (ref 6.0–8.3)

## 2013-09-09 LAB — LIPID PANEL
Cholesterol: 178 mg/dL (ref 0–200)
HDL: 65 mg/dL (ref >39)
LDL Cholesterol: 100 mg/dL — ABNORMAL HIGH (ref 0–99)
Total CHOL/HDL Ratio: 2.7 Ratio
Triglycerides: 63 mg/dL (ref <150)
VLDL: 13 mg/dL (ref 0–40)

## 2013-09-10 ENCOUNTER — Encounter: Payer: Self-pay | Admitting: Internal Medicine

## 2013-09-10 ENCOUNTER — Ambulatory Visit (INDEPENDENT_AMBULATORY_CARE_PROVIDER_SITE_OTHER): Payer: BC Managed Care – PPO | Admitting: Internal Medicine

## 2013-09-10 VITALS — BP 124/78 | HR 72 | Temp 98.6°F | Ht 72.5 in | Wt 206.0 lb

## 2013-09-10 DIAGNOSIS — E785 Hyperlipidemia, unspecified: Secondary | ICD-10-CM

## 2013-09-10 DIAGNOSIS — J309 Allergic rhinitis, unspecified: Secondary | ICD-10-CM

## 2013-09-10 DIAGNOSIS — Z0000 Encounter for general adult medical examination without abnormal findings: Secondary | ICD-10-CM

## 2013-09-10 LAB — POCT URINALYSIS DIPSTICK
Bilirubin, UA: NEGATIVE
Blood, UA: NEGATIVE
Glucose, UA: NEGATIVE
Ketones, UA: NEGATIVE
Leukocytes, UA: NEGATIVE
Nitrite, UA: NEGATIVE
Protein, UA: NEGATIVE
Spec Grav, UA: <=1.005
Urobilinogen, UA: NEGATIVE
pH, UA: 7.5

## 2013-09-10 LAB — PSA: PSA: 0.52 ng/mL (ref <=4.00)

## 2013-09-10 NOTE — Progress Notes (Signed)
Subjective:    Patient ID: Joel Coffey, male    DOB: [birth year], 53 y.o.   MRN: [id number]  HPI  53 year old White male in today for health maintenance exam and they wish of medical issues. History of allergic rhinitis, migraine headaches, allergic rhinitis, mitral regurgitation, vitamin D deficiency. He is on Zocor 10 mg daily and takes daily Claritin. Seldom has headaches. Has been on Zocor since 2010.  No known drug allergies. There is a question in the past   of being allergic to Augmentin. He went to allergist in [place] and had formal testing for penicillin allergy. Was found NOT to have penicillin allergy.  Past medical history: Colonoscopy April 2011. Fractured right tibia when he was less than 73 years old. Right inguinal hernia repair 1982. Mastectomy 2002. Allergy testing done in 2009 showed very strong reactivity to multiple grasses and tree pollens. He had mild reactivity to selected molds, dust mite, cat dander and cockroach.   He had an episode of scrotal pain in 2004 and was treated for presumed epididymitis.  Records indicate he had normal lab work in 2004 with the exception of LDL cholesterol of 129 and total cholesterol of 205 during an insurance examination. Old records also indicate that he had possible irritable bowel syndrome treated with Nu-lev but this is not an issue at this time.  History of cardiac murmur dating back to late 1990s. He has been told he does not need SBE prophylaxis. Dr. Verl Blalock saw him in 2007, ordered a 2-D echocardiogram showing trivial mitral valvular regurgitation. No evidence of mitral stenosis or mitral valve prolapse. Overall left ventricular systolic function normal.  Social history: He is married and has 2 children, a son and a daughter. Daughter is a Equities trader in high school. Wife is a Sports coach school professor at [employer] and is being treated for breast cancer. Nonsmoker. Social alcohol consumption. Runs several days a week about 3 miles a  day.  Family history: Father died at age 68 of lymphoma. Mother living with history of skin cancer but generally well. 4 brothers in good health. No sisters.   Review of Systems  Constitutional: Negative.   Allergic/Immunologic: Positive for environmental allergies.  All other systems reviewed and are negative.      Objective:   Physical Exam  Vitals reviewed. Constitutional: He is oriented to person, place, and time. He appears well-developed and well-nourished. No distress.  HENT:  Head: Normocephalic and atraumatic.  Right Ear: External ear normal.  Left Ear: External ear normal.  Mouth/Throat: Oropharynx is clear and moist. No oropharyngeal exudate.  Eyes: Conjunctivae and EOM are normal. Pupils are equal, round, and reactive to light. Right eye exhibits no discharge. Left eye exhibits no discharge. No scleral icterus.  Neck: Neck supple. No JVD present. No thyromegaly present.  Cardiovascular: Normal rate and regular rhythm.   Murmur heard. Murmur unchanged from previous exam. 2/6 systolic ejection murmur heard along left sternal border  Pulmonary/Chest: Effort normal and breath sounds normal. No respiratory distress. He has no wheezes. He has no rales. He exhibits no tenderness.  Abdominal: Soft. Bowel sounds are normal. He exhibits no distension and no mass. There is no tenderness. There is no rebound and no guarding.  Genitourinary: Prostate normal.  Musculoskeletal: Normal range of motion. He exhibits no edema.  Lymphadenopathy:    He has no cervical adenopathy.  Neurological: He is alert and oriented to person, place, and time. He has normal reflexes. He displays normal reflexes. No cranial nerve  deficit. Coordination normal.  Skin: Skin is warm and dry. No rash noted. He is not diaphoretic.  Psychiatric: He has a normal mood and affect. His behavior is normal. Judgment and thought content normal.          Assessment & Plan:  Hyperlipidemia-stable on lipid-lowering  medication  Trivial mitral regurgitation with audible murmur-stable and no need for SBE prophylaxis  Allergic rhinitis-treated with Claritin over-the-counter  Plan: Medical problems are stable and he may return in one year or as needed. Continue statin medication and Claritin. He exercises regularly.

## 2013-09-10 NOTE — Patient Instructions (Signed)
Continue same medications and return in one year. Have flu shot in the Fall. It was a pleasure to see you today. Continue diet and exercise efforts.

## 2013-09-28 ENCOUNTER — Encounter: Payer: Self-pay | Admitting: Internal Medicine

## 2013-09-28 ENCOUNTER — Ambulatory Visit (INDEPENDENT_AMBULATORY_CARE_PROVIDER_SITE_OTHER): Payer: BC Managed Care – PPO | Admitting: Internal Medicine

## 2013-09-28 VITALS — BP 134/82 | Temp 98.5°F | Wt 206.0 lb

## 2013-09-28 DIAGNOSIS — S30860A Insect bite (nonvenomous) of lower back and pelvis, initial encounter: Secondary | ICD-10-CM

## 2013-09-28 DIAGNOSIS — S30861A Insect bite (nonvenomous) of abdominal wall, initial encounter: Secondary | ICD-10-CM

## 2013-09-28 DIAGNOSIS — W57XXXA Bitten or stung by nonvenomous insect and other nonvenomous arthropods, initial encounter: Secondary | ICD-10-CM

## 2013-09-28 MED ORDER — HYDROCORTISONE 2.5 % EX CREA: TOPICAL_CREAM | Freq: Two times a day (BID) | CUTANEOUS | Status: DC

## 2013-09-28 NOTE — Progress Notes (Signed)
   Subjective:    Patient ID: Joel Coffey, male    DOB: 16-Feb-1961, 53 y.o.   MRN: [id number]  HPI  Tick bite 5 or 6 days ago walking through a field in [place]. It was a small tic which was removed. Now has  rash in right groin that is itchy with a small lump. No fever no headache. No rash on palms or feet.    Review of Systems     Objective:   Physical Exam  Has petechial rash right groin with a small palpable nodule where tick attached.      Assessment & Plan:  Tick bite right groin  Plan: To 1/2% hydrocortisone cream apply to right groin twice daily for several days until symptoms resolve. Patient was reassured, if he develops symptoms he will recontact me.

## 2013-09-28 NOTE — Patient Instructions (Addendum)
Apply 2.5% hydrocortisone cream twice daily until healed. Call if symptoms develop such as headache or rash on palms or feet or fever

## 2013-11-29 ENCOUNTER — Other Ambulatory Visit: Payer: Self-pay | Admitting: Internal Medicine

## 2014-04-11 ENCOUNTER — Other Ambulatory Visit: Payer: Self-pay | Admitting: Internal Medicine

## 2014-07-20 ENCOUNTER — Other Ambulatory Visit: Payer: Self-pay | Admitting: Internal Medicine

## 2014-08-08 ENCOUNTER — Encounter: Payer: Self-pay | Admitting: Internal Medicine

## 2014-08-08 ENCOUNTER — Ambulatory Visit (INDEPENDENT_AMBULATORY_CARE_PROVIDER_SITE_OTHER): Payer: BLUE CROSS/BLUE SHIELD | Admitting: Internal Medicine

## 2014-08-08 VITALS — BP 130/84 | HR 83 | Temp 98.2°F | Ht 72.0 in | Wt 206.0 lb

## 2014-08-08 DIAGNOSIS — J069 Acute upper respiratory infection, unspecified: Secondary | ICD-10-CM | POA: Diagnosis not present

## 2014-08-08 MED ORDER — CLARITHROMYCIN 500 MG PO TABS: 500.0000 mg | ORAL_TABLET | Freq: Two times a day (BID) | ORAL | Status: DC

## 2014-08-08 NOTE — Patient Instructions (Addendum)
Take Biaxin 500 mg twice daily for 10 days.

## 2014-08-08 NOTE — Progress Notes (Signed)
   Subjective:    Patient ID: Joel Coffey, male    DOB: 12-07-1960, 54 y.o.   MRN: [id number]  HPI  Two week history of URI symptoms. Started out as sore throat and head congestion. He tried over-the-counter decongestants which did not help all that much. Now symptoms have moved into his chest. No fever or shaking chills. Coughing but no significant sputum production. No myalgias. Has been extremely tired. History of hyperlipidemia treated with statin medication. Hasn't felt like exercising.    Review of Systems     Objective:   Physical Exam  Skin: warm and dry. Nodes: none. TMs are clear. Pharynx is clear without exudate. Neck is supple. Chest: clear to auscultation without rales or wheezing.      Assessment & Plan:   Acute URI  Plan: Biaxin 500 mg twice daily for 10 days. Call if not better in 10 days or sooner if worse. Appointment made for physical examination April 2016. Last physical exam April 2015.

## 2014-09-13 ENCOUNTER — Other Ambulatory Visit: Payer: BLUE CROSS/BLUE SHIELD | Admitting: Internal Medicine

## 2014-09-15 ENCOUNTER — Encounter: Payer: BLUE CROSS/BLUE SHIELD | Admitting: Internal Medicine

## 2014-09-23 ENCOUNTER — Encounter (INDEPENDENT_AMBULATORY_CARE_PROVIDER_SITE_OTHER): Payer: Self-pay

## 2014-09-23 ENCOUNTER — Other Ambulatory Visit: Payer: BLUE CROSS/BLUE SHIELD | Admitting: Internal Medicine

## 2014-09-23 DIAGNOSIS — Z125 Encounter for screening for malignant neoplasm of prostate: Secondary | ICD-10-CM

## 2014-09-23 DIAGNOSIS — Z0000 Encounter for general adult medical examination without abnormal findings: Secondary | ICD-10-CM

## 2014-09-23 DIAGNOSIS — Z13220 Encounter for screening for lipoid disorders: Secondary | ICD-10-CM

## 2014-09-23 DIAGNOSIS — E559 Vitamin D deficiency, unspecified: Secondary | ICD-10-CM

## 2014-09-23 LAB — CBC WITH DIFFERENTIAL/PLATELET
Basophils Absolute: 0.1 10*3/uL (ref 0.0–0.1)
Basophils Relative: 1 % (ref 0–1)
Eosinophils Absolute: 0.2 10*3/uL (ref 0.0–0.7)
Eosinophils Relative: 3 % (ref 0–5)
HCT: 44.1 % (ref 39.0–52.0)
Hemoglobin: 15.5 g/dL (ref 13.0–17.0)
Lymphocytes Relative: 22 % (ref 12–46)
Lymphs Abs: 1.4 10*3/uL (ref 0.7–4.0)
MCH: 30.8 pg (ref 26.0–34.0)
MCHC: 35.1 g/dL (ref 30.0–36.0)
MCV: 87.5 fL (ref 78.0–100.0)
MPV: 9.3 fL (ref 8.6–12.4)
Monocytes Absolute: 0.5 10*3/uL (ref 0.1–1.0)
Monocytes Relative: 7 % (ref 3–12)
Neutro Abs: 4.4 10*3/uL (ref 1.7–7.7)
Neutrophils Relative %: 67 % (ref 43–77)
Platelets: 201 10*3/uL (ref 150–400)
RBC: 5.04 MIL/uL (ref 4.22–5.81)
RDW: 13.5 % (ref 11.5–15.5)
WBC: 6.5 10*3/uL (ref 4.0–10.5)

## 2014-09-23 LAB — COMPLETE METABOLIC PANEL WITH GFR
ALT: 34 U/L (ref 0–53)
AST: 31 U/L (ref 0–37)
Albumin: 4.1 g/dL (ref 3.5–5.2)
Alkaline Phosphatase: 71 U/L (ref 39–117)
BUN: 11 mg/dL (ref 6–23)
CO2: 21 mEq/L (ref 19–32)
Calcium: 9 mg/dL (ref 8.4–10.5)
Chloride: 101 mEq/L (ref 96–112)
Creat: 0.82 mg/dL (ref 0.50–1.35)
GFR, Est African American: >89 mL/min
GFR, Est Non African American: >89 mL/min
Glucose, Bld: 89 mg/dL (ref 70–99)
Potassium: 4.4 mEq/L (ref 3.5–5.3)
Sodium: 136 mEq/L (ref 135–145)
Total Bilirubin: 0.8 mg/dL (ref 0.2–1.2)
Total Protein: 6.8 g/dL (ref 6.0–8.3)

## 2014-09-23 LAB — LIPID PANEL
Cholesterol: 186 mg/dL (ref 0–200)
HDL: 62 mg/dL (ref >=40)
LDL Cholesterol: 109 mg/dL — ABNORMAL HIGH (ref 0–99)
Total CHOL/HDL Ratio: 3 Ratio
Triglycerides: 76 mg/dL (ref <150)
VLDL: 15 mg/dL (ref 0–40)

## 2014-09-24 LAB — PSA: PSA: 0.55 ng/mL (ref <=4.00)

## 2014-09-24 LAB — VITAMIN D 25 HYDROXY (VIT D DEFICIENCY, FRACTURES): Vit D, 25-Hydroxy: 23 ng/mL — ABNORMAL LOW (ref 30–100)

## 2014-09-26 ENCOUNTER — Encounter: Payer: Self-pay | Admitting: Internal Medicine

## 2014-09-26 ENCOUNTER — Ambulatory Visit (INDEPENDENT_AMBULATORY_CARE_PROVIDER_SITE_OTHER): Payer: BLUE CROSS/BLUE SHIELD | Admitting: Internal Medicine

## 2014-09-26 VITALS — BP 110/80 | HR 77 | Temp 98.0°F | Ht 72.0 in | Wt 206.0 lb

## 2014-09-26 DIAGNOSIS — I340 Nonrheumatic mitral (valve) insufficiency: Secondary | ICD-10-CM

## 2014-09-26 DIAGNOSIS — E785 Hyperlipidemia, unspecified: Secondary | ICD-10-CM

## 2014-09-26 DIAGNOSIS — J309 Allergic rhinitis, unspecified: Secondary | ICD-10-CM | POA: Diagnosis not present

## 2014-09-26 DIAGNOSIS — Z0000 Encounter for general adult medical examination without abnormal findings: Secondary | ICD-10-CM | POA: Diagnosis not present

## 2014-10-05 NOTE — Patient Instructions (Signed)
Continue same medications and return in one year. It was a pleasure to see you today.

## 2014-10-05 NOTE — Progress Notes (Signed)
Subjective:    Patient ID: Joel Coffey, male    DOB: Mar 08, 1961, 54 y.o.   MRN: [id number]  HPI  54 year old White male in today for health maintenance exam and evaluation of medical issues. Had colonoscopy 2011 with repeat study due in 2021. History of hyperlipidemia, allergic rhinitis, migraine headaches, mitral regurgitation, history of vitamin D deficiency. Seldom has headaches. Has been on Zocor since 2010.  No known drug allergies. At one point there was a question of Augmentin allergy. He subsequently went to allergist and plan: Had formal testing for penicillin allergy. He was found NOT to have penicillin allergy.  Past medical history: Fractured right tibia when he was less than 46 years old. Right inguinal hernia repair 1982. Mastectomy 2002. Allergy testing done in 2009 showed very strong reactivity to multiple grasses and tree pollens. He had mild reactivity to selected molds, dust mite, cat dander and cockroach.  Had episode of scrotal pain in 2004 and was treated for presumed epididymitis.  Records indicate he had normal lab work in 2004 with the exception of LDL cholesterol of 129 and total cholesterol of 205 during an insurance examination.  Old records also indicate he had possible irritable bowel syndrome treated with Nu-Lev but currently this is not an issue.  History of cardiac murmur dating back to the late 1990s. He has been told he does not need SBE prophylaxis. Dr. Orrin Brigham saw him in 2007, ordered a 2-D echocardiogram showing trivial mitral valvular regurgitation. No evidence of mitral stenosis or mitral valve prolapse. Overall, left ventricular systolic function was normal.  Social history: He's married and has 2 children, a son and a daughter. 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 Daughter attends [employer] and is finishing her Freshman year there. Wife is a Engineer, materials at [employer]. Wife has history of breast  cancer. Patient is a nonsmoker. Social alcohol consumption. He runs several days a week about 3 miles at a time.  Family history: Father died at age 73 of lymphoma. Mother living with history of skin cancer but generally in good health. 4 brothers in good health. No sisters.    Review of Systems  Constitutional: Negative.   Allergic/Immunologic: Positive for environmental allergies.  All other systems reviewed and are negative.      Objective:   Physical Exam  Constitutional: He is oriented to person, place, and time. He appears well-developed and well-nourished. No distress.  HENT:  Head: Normocephalic and atraumatic.  Right Ear: External ear normal.  Left Ear: External ear normal.  Mouth/Throat: Oropharynx is clear and moist. No oropharyngeal exudate.  Eyes: Conjunctivae and EOM are normal. Pupils are equal, round, and reactive to light. Right eye exhibits no discharge. Left eye exhibits no discharge. No scleral icterus.  Neck: Neck supple. No JVD present. No thyromegaly present.  Cardiovascular: Normal rate and regular rhythm.   Murmur heard. 2/6 systolic ejection murmur unchanged. It is heard along the left sternal border.  Pulmonary/Chest: Effort normal and breath sounds normal.  Abdominal: Soft. Bowel sounds are normal. He exhibits no distension and no mass. There is no tenderness. There is no rebound and no guarding.  Genitourinary: Prostate normal.  Musculoskeletal: Normal range of motion. He exhibits no edema.  Lymphadenopathy:    He has no cervical adenopathy.  Neurological: He is alert and oriented to person, place, and time. He has normal reflexes. No cranial nerve deficit. Coordination normal.  Skin: Skin is warm and dry. No  rash noted. He is not diaphoretic.  Psychiatric: He has a normal mood and affect. His behavior is normal. Judgment and thought content normal.  Vitals reviewed.         Assessment & Plan:  Hyperlipidemia-lipids normal on lipid-lowering  medication. Continue same dose and return in one year.  History of allergic rhinitis treated with over-the-counter antihistamine  Trivial mitral regurgitation with audible murmur-stable and no need for SBE prophylaxis  Plan: Return in one year or as needed. Medical problems are stable. He exercises regularly. Continue statin medication.

## 2014-11-21 ENCOUNTER — Encounter: Payer: Self-pay | Admitting: Internal Medicine

## 2014-11-21 ENCOUNTER — Ambulatory Visit (INDEPENDENT_AMBULATORY_CARE_PROVIDER_SITE_OTHER): Payer: BLUE CROSS/BLUE SHIELD | Admitting: Internal Medicine

## 2014-11-21 VITALS — BP 128/84 | HR 78 | Temp 98.0°F | Wt 211.0 lb

## 2014-11-21 DIAGNOSIS — K2289 Other specified disease of esophagus: Secondary | ICD-10-CM

## 2014-11-21 DIAGNOSIS — K228 Other specified diseases of esophagus: Secondary | ICD-10-CM

## 2014-11-21 DIAGNOSIS — K224 Dyskinesia of esophagus: Secondary | ICD-10-CM

## 2014-11-21 NOTE — Progress Notes (Signed)
   Subjective:    Patient ID: Joel Coffey, male    DOB: [birth year], 54 y.o.   MRN: [id number]  HPI Patient says it recently several times a month generally when he is eating dinner that he's experienced an issue with pain in his chest while swallowing food. Doesn't have to be any particular food. If he relaxes for a moment, the pain will subside. He's concerned about this because he said his brother died of a ruptured esophagus but doesn't know why brother had such a condition. Patient thinks it may be an anxiety component to this. Not sure if he has any reflux symptoms but thinks he might. Says episodes do not usually occur at lunch or breakfast.    Review of Systems no abdominal pain. No change in bowel movements.     Objective:   Physical Exam  No hepatosplenomegaly or masses. No tenderness in abdomen.      Assessment & Plan:  Probable esophageal spasm  Possible GE reflux  ? Anxiety  Plan: Patient will try Nexium 24 over-the-counter on a daily basis. He is to have a barium swallow to see if he has a stricture, ring or web contributing to the symptoms. Since it doesn't happen very often, he may not want to be on chronic daily medication for this. Discussed treatment options with him including calcium channel blockers and try cyclic antidepressives if indeed this turns out to be esophageal spasm. He will go later this week for barium swallow.

## 2014-11-21 NOTE — Patient Instructions (Signed)
Take Nexium 24 daily. Have barium swallow this week.

## 2014-11-23 ENCOUNTER — Other Ambulatory Visit: Payer: Self-pay

## 2014-11-23 ENCOUNTER — Other Ambulatory Visit: Payer: Self-pay | Admitting: Internal Medicine

## 2014-12-07 ENCOUNTER — Telehealth: Payer: Self-pay | Admitting: *Deleted

## 2014-12-07 ENCOUNTER — Ambulatory Visit
Admission: RE | Admit: 2014-12-07 | Discharge: 2014-12-07 | Disposition: A | Discharge status: Home / Self Care | Payer: BLUE CROSS/BLUE SHIELD | Source: Ambulatory Visit | Attending: Internal Medicine | Admitting: Internal Medicine

## 2014-12-07 DIAGNOSIS — K228 Other specified diseases of esophagus: Secondary | ICD-10-CM

## 2014-12-07 DIAGNOSIS — K2289 Other specified disease of esophagus: Secondary | ICD-10-CM

## 2014-12-07 MED ORDER — PANTOPRAZOLE SODIUM 40 MG PO TBEC: 40.0000 mg | DELAYED_RELEASE_TABLET | Freq: Every day | ORAL | Status: DC

## 2014-12-07 NOTE — Telephone Encounter (Signed)
Reviewed xray results with patient .script sent to patient pharmacy for protonix

## 2015-01-14 ENCOUNTER — Other Ambulatory Visit: Payer: Self-pay | Admitting: Internal Medicine

## 2015-02-08 ENCOUNTER — Other Ambulatory Visit: Payer: Self-pay | Admitting: Internal Medicine

## 2015-04-22 ENCOUNTER — Other Ambulatory Visit: Payer: Self-pay | Admitting: Internal Medicine

## 2015-08-06 ENCOUNTER — Ambulatory Visit (INDEPENDENT_AMBULATORY_CARE_PROVIDER_SITE_OTHER): Payer: BLUE CROSS/BLUE SHIELD | Admitting: Family Medicine

## 2015-08-06 VITALS — BP 128/82 | HR 82 | Temp 98.4°F | Resp 17 | Ht 72.0 in | Wt 208.0 lb

## 2015-08-06 DIAGNOSIS — A46 Erysipelas: Secondary | ICD-10-CM

## 2015-08-06 MED ORDER — DOXYCYCLINE HYCLATE 100 MG PO CAPS: 100.0000 mg | ORAL_CAPSULE | Freq: Two times a day (BID) | ORAL | Status: DC

## 2015-08-06 MED ORDER — CEFTRIAXONE SODIUM 1 G IJ SOLR
1.0000 g | Freq: Once | INTRAMUSCULAR | Status: AC
  Administered 2015-08-06: 1 g via INTRAMUSCULAR

## 2015-08-06 NOTE — Patient Instructions (Addendum)
IF you received an x-ray today, you will receive an invoice from [facility]. Please contact [facility] at [phone] with questions or concerns regarding your invoice.   IF you received labwork today, you will receive an invoice from [employer]. Please contact [facility] at [phone] with questions or concerns regarding your invoice.   Our billing staff will not be able to assist you with questions regarding bills from these companies.  You will be contacted with the lab results as soon as they are available. The fastest way to get your results is to activate your My Chart account. Instructions are located on the last page of this paperwork. If you have not heard from [place] regarding the results in 2 weeks, please contact this office.  Erysipelas Erysipelas is an infection that affects the skin and the tissues that are near the surface of the skin. It causes the skin to become red, swollen, and painful. The infection is most common on the legs but may also affect other areas, such as the face. With treatment, the infection usually goes away in a few days. If not treated, the infection can spread or lead to other problems, such as abscesses. CAUSES Erysipelas is caused by bacteria. Most often, it is caused by bacteria called streptococci. The bacteria often enter through a break in the skin, such as a cut, surgical incision, burn, insect bite, open sore, or crack in the skin. Sometimes the source where the bacteria entered is not known. RISK FACTORS Some people are at an increased risk for developing erysipelas, including:  Young children.  Elderly people.  People with a weakened body defense system (immune system), such as people with HIV or AIDS.  People who have diabetes.  People who drink too much alcohol.  People who have had recent surgery.  People with yeast infections of the skin.  People who have swollen legs. SIGNS AND  SYMPTOMS The infection causes a reddened area on the skin. This reddened area may:  Be painful and swollen.  Have a distinct border around it.  Feel itchy and hot.  Develop blisters. Other symptoms may include:  Fever.  Chills.  Nausea and vomiting.  Swollen glands (lymph nodes).  Headache.  Fatigue.  Loss of appetite. DIAGNOSIS Your health care provider will take your medical history and do a physical exam. He or she will usually be able to diagnose erysipelas by closely examining your skin. TREATMENT Erysipelas can usually be treated effectively with antibiotic medicines. The infection usually gets better within a few days of treatment. HOME CARE INSTRUCTIONS  Take medicines only as directed by your health care provider.  Take your antibiotic medicine as directed by your health care provider. Finish the antibiotic even if you start to feel better.  If the skin infection is on your leg or arm, elevate the leg or arm to help reduce swelling.  Do not put creams or lotions on the affected area of your skin unless your health care provider instructs you to do that.  Do not share bedding, towels, or washcloths (linens) with other people. Using only your own linens will help to prevent the infection from spreading to others.  Keep all follow-up visits as directed by your health care provider. This is important. SEEK MEDICAL CARE IF:  You have pain or discomfort that is not controlled by medicines.  Your red area of skin gets larger or turns dark in color.  Your skin infection returns in the same area or  appears in another area. SEEK IMMEDIATE MEDICAL CARE IF:  Your fever is getting worse.  Your feelings of illness are getting worse.  You notice red streaks coming from the infected area.   This information is not intended to replace advice given to you by your health care provider. Make sure you discuss any questions you have with your health care provider.    Document Released: 02/05/2001 Document Revised: 06/03/2014 Document Reviewed: 12/27/2013 Elsevier Interactive Patient Education [employer].

## 2015-08-06 NOTE — Progress Notes (Signed)
Subjective:    Patient ID: Joel Coffey, male    DOB: [birth year], 55 y.o.   MRN: [id number] By signing my name below, I, Judithe Modest, attest that this documentation has been prepared under the direction and in the presence of Delman Cheadle, MD. Electronically Signed: Judithe Modest, ER Scribe. 08/06/2015. 11:00 AM.  Chief Complaint  Patient presents with  . Facial Swelling    under right eye noticed redness on Friday    HPI HPI Comments: Joel Coffey is a 55 y.o. male who presents to [facility] complaining of a red rash on the right side of his face. He denies fever or chills. He denies eye pain. He denies URI sx. He denies gastrointestinal sx. He went to the gym two days ago, and later that day he noted some mild irritation on the right side of his face. The following morning the irritation had progressed and he has some slight swelling and redness. This morning he woke up and the swelling and redness had significantly progressed, increasing both in the size of swelling and the area of redness.    Past Medical History  Diagnosis Date  . Hyperlipidemia   . Mitral regurgitation   . Migraine headache   . Vitamin D deficiency    Allergies  Allergen Reactions  . Amoxicillin-Pot Clavulanate     REACTION: hives   Current Outpatient Prescriptions on File Prior to Visit  Medication Sig Dispense Refill  . ibuprofen (ADVIL,MOTRIN) 800 MG tablet TAKE 1 TABLET BY MOUTH EVERY 8 HOURS AS NEEDED FOR PAIN 90 tablet 3  . loratadine (CLARITIN) 10 MG tablet Take 10 mg by mouth daily.    . Multiple Vitamins-Minerals (MULTIVITAMIN,TX-MINERALS) tablet Take 1 tablet by mouth daily.      . pantoprazole (PROTONIX) 40 MG tablet TAKE 1 TABLET(40 MG) BY MOUTH DAILY 30 tablet 11  . simvastatin (ZOCOR) 10 MG tablet TAKE 1 TABLET BY MOUTH EVERY DAY 30 tablet 11   No current facility-administered medications on file prior to visit.    Review of Systems  Constitutional: Negative for fever, chills, activity change  and appetite change.  HENT: Positive for congestion, facial swelling and sinus pressure. Negative for ear pain and trouble swallowing.   Eyes: Negative for pain, discharge, redness and visual disturbance.  Respiratory: Negative for cough and chest tightness.   Musculoskeletal: Negative for myalgias.  Skin: Positive for color change and rash.  Neurological: Positive for facial asymmetry. Negative for numbness.  Hematological: Negative for adenopathy.      Objective:  BP 128/82 mmHg  Pulse 82  Temp(Src) 98.4 F (36.9 C) (Oral)  Resp 17  Ht 6' (1.829 m)  Wt 208 lb (94.348 kg)  BMI 28.20 kg/m2  SpO2 97%  Physical Exam  Constitutional: He is oriented to person, place, and time. He appears well-developed and well-nourished. No distress.  HENT:  Head: Normocephalic and atraumatic.  Eyes: Pupils are equal, round, and reactive to light.  Neck: Neck supple.  Cardiovascular: Normal rate.   Pulmonary/Chest: Effort normal. No respiratory distress.  Musculoskeletal: Normal range of motion.  Neurological: He is alert and oriented to person, place, and time. Coordination normal.  Skin: Skin is warm and dry. He is not diaphoretic.  Two 3m cm areas of nodular induration on the right cheek with the upper causing wider induration and edema spreading to orbital rim and canthus.  Psychiatric: He has a normal mood and affect. His behavior is normal.  Nursing note and vitals reviewed.  Assessment & Plan:   1. Erysipelas   RTC if worsening or no improvement in 2d. Warned of emergency if develops any orbital sxs or signs of systemic illness.  Meds ordered this encounter  Medications  . cefTRIAXone (ROCEPHIN) injection 1 g    Sig:     Order Specific Question:  Antibiotic Indication:    Answer:  Cellulitis  . doxycycline (VIBRAMYCIN) 100 MG capsule    Sig: Take 1 capsule (100 mg total) by mouth 2 (two) times daily.    Dispense:  20 capsule    Refill:  0    I personally performed the  services described in this documentation, which was scribed in my presence. The recorded information has been reviewed and considered, and addended by me as needed.  Delman Cheadle, MD MPH

## 2015-08-09 ENCOUNTER — Telehealth: Payer: Self-pay | Admitting: Internal Medicine

## 2015-08-09 ENCOUNTER — Ambulatory Visit (INDEPENDENT_AMBULATORY_CARE_PROVIDER_SITE_OTHER): Payer: BLUE CROSS/BLUE SHIELD | Admitting: Internal Medicine

## 2015-08-09 ENCOUNTER — Encounter: Payer: Self-pay | Admitting: Internal Medicine

## 2015-08-09 VITALS — BP 126/74 | HR 84 | Temp 97.7°F | Resp 20 | Ht 72.0 in | Wt 210.0 lb

## 2015-08-09 DIAGNOSIS — B029 Zoster without complications: Secondary | ICD-10-CM

## 2015-08-09 DIAGNOSIS — L989 Disorder of the skin and subcutaneous tissue, unspecified: Secondary | ICD-10-CM

## 2015-08-09 MED ORDER — VALACYCLOVIR HCL 1 G PO TABS: 1000.0000 mg | ORAL_TABLET | Freq: Three times a day (TID) | ORAL | Status: DC

## 2015-08-09 MED ORDER — DOXYCYCLINE HYCLATE 100 MG PO CAPS: 100.0000 mg | ORAL_CAPSULE | Freq: Two times a day (BID) | ORAL | Status: DC

## 2015-08-09 NOTE — Progress Notes (Signed)
   Subjective:    Patient ID: Joel Coffey, male    DOB: [birth year], 55 y.o.   MRN: [id number]  HPI Patient is a healthy 55 year old married Male who went to the gym on Thursday, March 9 and subsequently noticed a red spot on his right face near his zygomatic arch. Subsequently developed a second lesion below that. Has some erythema and swelling under his right eye, Also noticed his scalp was tender. He went to [facility] on [address] Sunday March 12. Was seen by Dr. Brigitte Pulse. Was thought to have cellulitis perhaps due to MRSA and was  prescribed doxycycline for 10 days. Has continued to develop new lesions. Has another lesion near his lip. Has been compliant with twice a day doxycycline therapy.    Review of Systems     Objective:   Physical Exam  He has some infraorbital swelling low right eye without erythema. Extraocular movements are full. No redness of eye. He has several discrete lesions that currently are not vesicular starting just below his right eye along zygomatic arch and ending at his right upper lip area. Continues to have vague soreness of his scalp area.      Assessment & Plan:  Possible Herpes zoster right face-Herpes viral culture sent today  Possible MRSA  Plan: He'll have eye exam by ophthalmologist. Has been seeing optometrist. Wears eyeglasses. It is a bit late to start Valtrex but we will try Valtrex 1000 mg 3 times daily for 7 days. Continue doxycycline 100 mg twice daily. Warm hot compresses to lesions 20 minutes twice daily. He is to send me a picture of these lesions tomorrow by phone. He will call if symptoms worsen.  25 minutes spent with patient

## 2015-08-09 NOTE — Telephone Encounter (Signed)
Appointment made with Dr. Mardene Speak @ [facility] for today, 08/09/15 @ 2pm to rule out shingles of the eye.  Dx:  Staph of the face.    Patient notified of the appointment date/time and address/phone # provided.

## 2015-08-09 NOTE — Patient Instructions (Signed)
See ophthalmologist day for eye exam. Valtrex 1000 mg 3 times daily for 7 days. Continue doxycycline 100 mg twice daily. Warm hot compresses 20 minutes twice daily. Call if symptoms worsen. Send picture by phone tomorrow.

## 2015-08-11 ENCOUNTER — Telehealth: Payer: Self-pay | Admitting: Internal Medicine

## 2015-08-11 NOTE — Telephone Encounter (Signed)
Patient sent a snapshot of his face to Dr. Renold Genta.  Swelling under his right eye continues to decrease and the spots on his face continue to heal and decrease in size as well.  Dr. Renold Genta advised to instruct patient to continue the course of his medications as prescribed.  Patient should send another picture on Monday morning, 08/14/15.  Spoke with patient and notified him.

## 2015-08-11 NOTE — Telephone Encounter (Signed)
Patient sent  picture of facial lesions by phone yesterday and today. Seems to be improving today and in fact he states he is much better. Ophthalmologist was to see him next week.. We will advise him that he needs to finish courses of Valtrex and doxycycline. Advised that he call us on Monday for telephone follow-up.

## 2015-08-14 LAB — HERPES CULTURE, RAPID

## 2015-08-14 NOTE — Telephone Encounter (Signed)
Patient sent a snapshot in this morning of the sores on his face.  They have significantly improved.  Dr. Renold Genta advised patient to finish the course of his medication.  Also, his labs are pended.  We will notify the patient once we have the results from [facility].

## 2015-08-14 NOTE — Progress Notes (Signed)
Per Rupert Stacks 5-7 day turn around time and we should expect results within the next 2 days. It is still pending.

## 2015-09-04 ENCOUNTER — Other Ambulatory Visit: Payer: Self-pay | Admitting: Internal Medicine

## 2015-09-04 ENCOUNTER — Other Ambulatory Visit: Payer: Self-pay

## 2015-09-04 MED ORDER — IBUPROFEN 800 MG PO TABS: 800.0000 mg | ORAL_TABLET | Freq: Three times a day (TID) | ORAL | Status: DC | PRN

## 2015-10-25 ENCOUNTER — Encounter: Payer: Self-pay | Admitting: Internal Medicine

## 2015-11-01 DIAGNOSIS — M7502 Adhesive capsulitis of left shoulder: Secondary | ICD-10-CM | POA: Diagnosis not present

## 2015-11-06 DIAGNOSIS — M25612 Stiffness of left shoulder, not elsewhere classified: Secondary | ICD-10-CM | POA: Diagnosis not present

## 2015-11-06 DIAGNOSIS — M7502 Adhesive capsulitis of left shoulder: Secondary | ICD-10-CM | POA: Diagnosis not present

## 2015-11-09 DIAGNOSIS — M7502 Adhesive capsulitis of left shoulder: Secondary | ICD-10-CM | POA: Diagnosis not present

## 2015-11-09 DIAGNOSIS — M25612 Stiffness of left shoulder, not elsewhere classified: Secondary | ICD-10-CM | POA: Diagnosis not present

## 2015-11-21 DIAGNOSIS — M7502 Adhesive capsulitis of left shoulder: Secondary | ICD-10-CM | POA: Diagnosis not present

## 2015-11-21 DIAGNOSIS — M25612 Stiffness of left shoulder, not elsewhere classified: Secondary | ICD-10-CM | POA: Diagnosis not present

## 2015-12-13 DIAGNOSIS — M7502 Adhesive capsulitis of left shoulder: Secondary | ICD-10-CM | POA: Diagnosis not present

## 2016-02-12 DIAGNOSIS — B0229 Other postherpetic nervous system involvement: Secondary | ICD-10-CM | POA: Diagnosis not present

## 2016-05-01 ENCOUNTER — Other Ambulatory Visit: Payer: Self-pay | Admitting: Internal Medicine

## 2016-05-01 NOTE — Telephone Encounter (Signed)
No CPE since May 2016. Please book before refilling

## 2016-08-06 ENCOUNTER — Other Ambulatory Visit: Payer: Self-pay | Admitting: Internal Medicine

## 2016-09-10 DIAGNOSIS — D225 Melanocytic nevi of trunk: Secondary | ICD-10-CM | POA: Diagnosis not present

## 2016-09-10 DIAGNOSIS — L821 Other seborrheic keratosis: Secondary | ICD-10-CM | POA: Diagnosis not present

## 2016-09-10 DIAGNOSIS — L814 Other melanin hyperpigmentation: Secondary | ICD-10-CM | POA: Diagnosis not present

## 2016-09-10 DIAGNOSIS — D1801 Hemangioma of skin and subcutaneous tissue: Secondary | ICD-10-CM | POA: Diagnosis not present

## 2016-09-30 ENCOUNTER — Other Ambulatory Visit: Payer: BLUE CROSS/BLUE SHIELD | Admitting: Internal Medicine

## 2016-09-30 DIAGNOSIS — Z125 Encounter for screening for malignant neoplasm of prostate: Secondary | ICD-10-CM

## 2016-09-30 DIAGNOSIS — Z0000 Encounter for general adult medical examination without abnormal findings: Secondary | ICD-10-CM

## 2016-09-30 DIAGNOSIS — E785 Hyperlipidemia, unspecified: Secondary | ICD-10-CM | POA: Diagnosis not present

## 2016-09-30 LAB — COMPREHENSIVE METABOLIC PANEL
ALT: 27 U/L (ref 9–46)
AST: 27 U/L (ref 10–35)
Albumin: 4.1 g/dL (ref 3.6–5.1)
Alkaline Phosphatase: 56 U/L (ref 40–115)
BUN: 10 mg/dL (ref 7–25)
CO2: 29 mmol/L (ref 20–31)
Calcium: 9 mg/dL (ref 8.6–10.3)
Chloride: 101 mmol/L (ref 98–110)
Creat: 0.85 mg/dL (ref 0.70–1.33)
Glucose, Bld: 87 mg/dL (ref 65–99)
Potassium: 4.1 mmol/L (ref 3.5–5.3)
Sodium: 137 mmol/L (ref 135–146)
Total Bilirubin: 0.7 mg/dL (ref 0.2–1.2)
Total Protein: 6.5 g/dL (ref 6.1–8.1)

## 2016-09-30 LAB — CBC WITH DIFFERENTIAL/PLATELET
Basophils Absolute: 0 cells/uL (ref 0–200)
Basophils Relative: 0 %
Eosinophils Absolute: 189 cells/uL (ref 15–500)
Eosinophils Relative: 3 %
HCT: 45.7 % (ref 38.5–50.0)
Hemoglobin: 15.8 g/dL (ref 13.2–17.1)
Lymphocytes Relative: 26 %
Lymphs Abs: 1638 cells/uL (ref 850–3900)
MCH: 31 pg (ref 27.0–33.0)
MCHC: 34.6 g/dL (ref 32.0–36.0)
MCV: 89.8 fL (ref 80.0–100.0)
MPV: 9.3 fL (ref 7.5–12.5)
Monocytes Absolute: 441 cells/uL (ref 200–950)
Monocytes Relative: 7 %
Neutro Abs: 4032 cells/uL (ref 1500–7800)
Neutrophils Relative %: 64 %
Platelets: 223 10*3/uL (ref 140–400)
RBC: 5.09 MIL/uL (ref 4.20–5.80)
RDW: 13.2 % (ref 11.0–15.0)
WBC: 6.3 10*3/uL (ref 3.8–10.8)

## 2016-09-30 LAB — LIPID PANEL
Cholesterol: 252 mg/dL — ABNORMAL HIGH (ref <200)
HDL: 65 mg/dL (ref >40)
LDL Cholesterol: 172 mg/dL — ABNORMAL HIGH (ref <100)
Total CHOL/HDL Ratio: 3.9 Ratio (ref <5.0)
Triglycerides: 77 mg/dL (ref <150)
VLDL: 15 mg/dL (ref <30)

## 2016-09-30 LAB — PSA: PSA: 0.5 ng/mL (ref <=4.0)

## 2016-10-02 ENCOUNTER — Encounter: Payer: Self-pay | Admitting: Internal Medicine

## 2016-10-03 ENCOUNTER — Encounter: Payer: Self-pay | Admitting: Internal Medicine

## 2016-11-02 ENCOUNTER — Other Ambulatory Visit: Payer: Self-pay | Admitting: Internal Medicine

## 2016-11-14 ENCOUNTER — Other Ambulatory Visit: Payer: Self-pay | Admitting: Internal Medicine

## 2016-12-27 ENCOUNTER — Encounter: Payer: Self-pay | Admitting: Internal Medicine

## 2016-12-27 ENCOUNTER — Ambulatory Visit (INDEPENDENT_AMBULATORY_CARE_PROVIDER_SITE_OTHER): Payer: BLUE CROSS/BLUE SHIELD | Admitting: Internal Medicine

## 2016-12-27 VITALS — BP 128/80 | HR 77 | Temp 97.7°F | Ht 72.0 in | Wt 204.0 lb

## 2016-12-27 DIAGNOSIS — Z23 Encounter for immunization: Secondary | ICD-10-CM | POA: Diagnosis not present

## 2016-12-27 DIAGNOSIS — Z0000 Encounter for general adult medical examination without abnormal findings: Secondary | ICD-10-CM | POA: Diagnosis not present

## 2016-12-27 DIAGNOSIS — E7800 Pure hypercholesterolemia, unspecified: Secondary | ICD-10-CM | POA: Diagnosis not present

## 2016-12-27 LAB — POCT URINALYSIS DIPSTICK
Bilirubin, UA: NEGATIVE
Blood, UA: NEGATIVE
Glucose, UA: NEGATIVE
Ketones, UA: NEGATIVE
Leukocytes, UA: NEGATIVE
Nitrite, UA: NEGATIVE
Protein, UA: NEGATIVE
Spec Grav, UA: <=1.005 — AB (ref 1.010–1.025)
Urobilinogen, UA: 0.2 E.U./dL
pH, UA: 6 (ref 5.0–8.0)

## 2016-12-27 MED ORDER — IBUPROFEN 800 MG PO TABS: 800.0000 mg | ORAL_TABLET | Freq: Three times a day (TID) | ORAL | 3 refills | Status: DC | PRN

## 2016-12-31 ENCOUNTER — Other Ambulatory Visit: Payer: BLUE CROSS/BLUE SHIELD | Admitting: Internal Medicine

## 2016-12-31 DIAGNOSIS — E785 Hyperlipidemia, unspecified: Secondary | ICD-10-CM | POA: Diagnosis not present

## 2016-12-31 LAB — LIPID PANEL
Cholesterol: 178 mg/dL (ref <200)
HDL: 73 mg/dL (ref >40)
LDL Cholesterol: 94 mg/dL (ref <100)
Total CHOL/HDL Ratio: 2.4 Ratio (ref <5.0)
Triglycerides: 55 mg/dL (ref <150)
VLDL: 11 mg/dL (ref <30)

## 2017-01-12 NOTE — Progress Notes (Signed)
Subjective:    Patient ID: Joel Coffey, male    DOB: 11/15/1960, 56 y.o.   MRN: [id number]  HPI Pleasant 56 year old White Male in today for health maintenance exam and evaluation of medical issues. Has not been taking lipid medication when he had lab work done in May  and lipid panel reflects that. He has resumed his lipid-lowering medication and will return on August 7 for fasting lipid panel. CBC, C met and PSA done in May were normal.  In addition to hyperlipidemia, he has history of migraine headaches, mitral regurgitation, allergic rhinitis. Seldom has headaches. Has been on lipid-lowering medication since 2010.  Had colonoscopy 2011 with repeat study due 2021.  No known drug allergies.  At one point there was a question of Augmentin allergy but he subsequently went to an allergist and had formal testing for Penicillin allergy and was found NOT to have Penicillin allergy.  Past medical history: Fractured right tibia when he was less than 78 years old. Right inguinal hernia repair 1982. Vasectomy 2002. Allergy testing done in 2009 showed very strong reactivity to multiple grasses and tree pollens. He had mild reactivity to selected molds, dust mite, cat dander and cockroach.  History of scrotal pain in 2004 and was treated for presumed epididymitis.  All records indicate he had possible irritable bowel syndrome treated with Nu-Lev but currently isn't an issue.  History of cardiac murmur dating back to late 1990s. He has been told he does not need SBE prophylaxis. Dr. Verl Blalock saw him in 2007, ordered a 2-D echocardiogram showing trivial mitral valvular regurgitation. No evidence of mitral stenosis or mitral valve prolapse. Overall left ventricular systolic function was normal.  Social history: He is married and has 2 children, a son and a daughter. 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 Daughter attends [employer]. Wife is a Engineer, materials at [employer]. Wife has history of breast cancer. Patient is a nonsmoker. Social alcohol consumption. He runs for exercise.  Family history: Father died at age 54 of lymphoma. Mother died 2016-04-10 at age 31 with complications of dementia. One brother died with ruptured esophagus. 3 other brothers alive and in good health. No sisters.      Review of Systems  Constitutional: Negative.   Allergic/Immunologic: Positive for environmental allergies.  All other systems reviewed and are negative.      Objective:   Physical Exam  Constitutional: He is oriented to person, place, and time. He appears well-developed and well-nourished. No distress.  HENT:  Head: Normocephalic and atraumatic.  Right Ear: External ear normal.  Left Ear: External ear normal.  Mouth/Throat: Oropharynx is clear and moist.  Eyes: Pupils are equal, round, and reactive to light. Conjunctivae and EOM are normal. Right eye exhibits no discharge. No scleral icterus.  Neck: Neck supple. No JVD present. No thyromegaly present.  Cardiovascular: Normal rate and intact distal pulses.   Murmur heard. 1/6 systolic ejection murmur unchanged best heard along left sternal border  Pulmonary/Chest: Effort normal and breath sounds normal. He has no wheezes.  Abdominal: Soft. Bowel sounds are normal. He exhibits no distension and no mass. There is no tenderness. There is no rebound and no guarding.  Genitourinary: Prostate normal.  Musculoskeletal: He exhibits no edema.  Lymphadenopathy:    He has no cervical adenopathy.  Neurological: He is alert and oriented to person, place, and time. He has normal reflexes. No cranial nerve deficit.  Skin: Skin is warm and  dry. No rash noted. He is not diaphoretic.  Psychiatric: He has a normal mood and affect. His behavior is normal. Judgment and thought content normal.  Vitals reviewed.         Assessment & Plan:  Normal health maintenance exam  Hyperlipidemia  Allergic  rhinitis  History of trivial mitral regurgitation-stable  Family history of dementia in mother  Plan: Continue same medications and return in one year or as needed.

## 2017-01-12 NOTE — Patient Instructions (Addendum)
It was a pleasure to see you today. Continue same medications and return in one year. Tetanus immunization update given today.

## 2017-02-11 ENCOUNTER — Other Ambulatory Visit: Payer: Self-pay | Admitting: Internal Medicine

## 2017-02-17 ENCOUNTER — Other Ambulatory Visit: Payer: Self-pay | Admitting: Internal Medicine

## 2017-09-09 ENCOUNTER — Other Ambulatory Visit: Payer: Self-pay | Admitting: Internal Medicine

## 2017-09-10 DIAGNOSIS — D1801 Hemangioma of skin and subcutaneous tissue: Secondary | ICD-10-CM | POA: Diagnosis not present

## 2017-09-10 DIAGNOSIS — L821 Other seborrheic keratosis: Secondary | ICD-10-CM | POA: Diagnosis not present

## 2017-09-10 DIAGNOSIS — D485 Neoplasm of uncertain behavior of skin: Secondary | ICD-10-CM | POA: Diagnosis not present

## 2017-09-10 DIAGNOSIS — D225 Melanocytic nevi of trunk: Secondary | ICD-10-CM | POA: Diagnosis not present

## 2017-09-10 DIAGNOSIS — L814 Other melanin hyperpigmentation: Secondary | ICD-10-CM | POA: Diagnosis not present

## 2017-12-03 ENCOUNTER — Other Ambulatory Visit: Payer: Self-pay | Admitting: Internal Medicine

## 2017-12-22 ENCOUNTER — Encounter: Payer: Self-pay | Admitting: Internal Medicine

## 2017-12-30 ENCOUNTER — Other Ambulatory Visit: Payer: Self-pay | Admitting: Internal Medicine

## 2017-12-31 MED ORDER — SIMVASTATIN 10 MG PO TABS: 10.0000 mg | ORAL_TABLET | Freq: Every day | ORAL | 0 refills | Status: DC

## 2017-12-31 NOTE — Telephone Encounter (Signed)
Left message for patient to return call.

## 2017-12-31 NOTE — Addendum Note (Signed)
Addended by: Mady Haagensen on: 12/31/2017 11:32 AM   Modules accepted: Orders

## 2017-12-31 NOTE — Telephone Encounter (Signed)
CPE was due August 3 , 2019. Please call to schedule before refilling

## 2018-01-27 ENCOUNTER — Other Ambulatory Visit: Payer: Self-pay | Admitting: Internal Medicine

## 2018-01-31 ENCOUNTER — Encounter: Payer: Self-pay | Admitting: Family

## 2018-01-31 ENCOUNTER — Ambulatory Visit: Payer: Self-pay | Admitting: Family

## 2018-01-31 VITALS — BP 130/85 | HR 91 | Temp 97.9°F | Resp 18 | Wt 195.8 lb

## 2018-01-31 DIAGNOSIS — J019 Acute sinusitis, unspecified: Secondary | ICD-10-CM

## 2018-01-31 DIAGNOSIS — B9689 Other specified bacterial agents as the cause of diseases classified elsewhere: Secondary | ICD-10-CM

## 2018-01-31 MED ORDER — FLUTICASONE PROPIONATE 50 MCG/ACT NA SUSP: 2.0000 | Freq: Every day | NASAL | 1 refills | Status: DC

## 2018-01-31 MED ORDER — DOXYCYCLINE HYCLATE 100 MG PO TABS: 100.0000 mg | ORAL_TABLET | Freq: Two times a day (BID) | ORAL | 0 refills | Status: DC

## 2018-01-31 NOTE — Progress Notes (Signed)
Subjective:     Patient ID: Wilmont Olund, male   DOB: 12-01-1960, 57 y.o.   MRN: [id number]  HPI 57 year old male is in today with c/o sinus congestion, pressure and stuffiness x 3 weeks and worsening despite taking Sudafed daily  Review of Systems  All other systems reviewed and are negative.  Past Medical History:  Diagnosis Date  . Hyperlipidemia   . Migraine headache   . Mitral regurgitation   . Vitamin D deficiency     Social History   Socioeconomic History  . Marital status: Married    Spouse name: Not on file  . Number of children: Not on file  . Years of education: Not on file  . Highest education level: Not on file  Occupational History  . Not on file  Social Needs  . Financial resource strain: Not on file  . Food insecurity:    Worry: Not on file    Inability: Not on file  . Transportation needs:    Medical: Not on file    Non-medical: Not on file  Tobacco Use  . Smoking status: Never Smoker  . Smokeless tobacco: Never Used  Substance and Sexual Activity  . Alcohol use: Yes  . Drug use: No  . Sexual activity: Not on file  Lifestyle  . Physical activity:    Days per week: Not on file    Minutes per session: Not on file  . Stress: Not on file  Relationships  . Social connections:    Talks on phone: Not on file    Gets together: Not on file    Attends religious service: Not on file    Active member of club or organization: Not on file    Attends meetings of clubs or organizations: Not on file    Relationship status: Not on file  . Intimate partner violence:    Fear of current or ex partner: Not on file    Emotionally abused: Not on file    Physically abused: Not on file    Forced sexual activity: Not on file  Other Topics Concern  . Not on file  Social History Narrative  . Not on file    Past Surgical History:  Procedure Laterality Date  . HERNIA REPAIR    . VASECTOMY      Family History  Problem Relation Age of Onset  . Cancer Father      Allergies  Allergen Reactions  . Amoxicillin-Pot Clavulanate     REACTION: hives    Current Outpatient Medications on File Prior to Visit  Medication Sig Dispense Refill  . ibuprofen (ADVIL,MOTRIN) 800 MG tablet TAKE 1 TABLET(800 MG) BY MOUTH EVERY 8 HOURS AS NEEDED FOR PAIN 90 tablet 0  . loratadine (CLARITIN) 10 MG tablet Take 10 mg by mouth daily.    . Multiple Vitamins-Minerals (MULTIVITAMIN,TX-MINERALS) tablet Take 1 tablet by mouth daily.      . pantoprazole (PROTONIX) 40 MG tablet TAKE 1 TABLET BY MOUTH DAILY 90 tablet 3  . pseudoephedrine (SUDAFED) 30 MG/5ML syrup Take by mouth 4 (four) times daily as needed for congestion.    . simvastatin (ZOCOR) 10 MG tablet Take 1 tablet (10 mg total) by mouth daily. 90 tablet 0   No current facility-administered medications on file prior to visit.     BP 130/85 (BP Location: Right Arm, Patient Position: Sitting, Cuff Size: Normal)   Pulse 91   Temp 97.9 F (36.6 C) (Oral)   Resp 18  Wt 195 lb 12.8 oz (88.8 kg)   SpO2 96%   BMI 26.56 kg/m chart    Objective:   Physical Exam  Constitutional: He is oriented to person, place, and time. He appears well-developed and well-nourished.  HENT:  Right Ear: External ear normal.  Left Ear: External ear normal.  Mouth/Throat: Oropharynx is clear and moist.  Maxillary sinus tenderness to palpation  Neck: Normal range of motion. Neck supple.  Cardiovascular: Normal rate, regular rhythm and normal heart sounds.  Pulmonary/Chest: Effort normal and breath sounds normal.  Abdominal: Soft. Bowel sounds are normal.  Musculoskeletal: Normal range of motion.  Neurological: He is alert and oriented to person, place, and time.  Skin: Skin is warm and dry.  Psychiatric: He has a normal mood and affect.       Assessment:     Michaiah was seen today for sinus problem, nasal congestion, cough and headache.  Diagnoses and all orders for this visit:  Acute bacterial sinusitis  Other orders -      doxycycline (VIBRA-TABS) 100 MG tablet; Take 1 tablet (100 mg total) by mouth 2 (two) times daily. -     fluticasone (FLONASE) 50 MCG/ACT nasal spray; Place 2 sprays into both nostrils daily.       Plan:     Call with any questions or concerns. Recheck as needed

## 2018-01-31 NOTE — Patient Instructions (Signed)

## 2018-02-24 ENCOUNTER — Other Ambulatory Visit: Payer: Self-pay | Admitting: Internal Medicine

## 2018-03-02 ENCOUNTER — Other Ambulatory Visit: Payer: BLUE CROSS/BLUE SHIELD | Admitting: Internal Medicine

## 2018-03-02 DIAGNOSIS — Z125 Encounter for screening for malignant neoplasm of prostate: Secondary | ICD-10-CM

## 2018-03-02 DIAGNOSIS — Z0000 Encounter for general adult medical examination without abnormal findings: Secondary | ICD-10-CM

## 2018-03-02 DIAGNOSIS — E7800 Pure hypercholesterolemia, unspecified: Secondary | ICD-10-CM | POA: Diagnosis not present

## 2018-03-03 LAB — COMPLETE METABOLIC PANEL WITH GFR
AG Ratio: 1.8 (calc) (ref 1.0–2.5)
ALT: 26 U/L (ref 9–46)
AST: 29 U/L (ref 10–35)
Albumin: 4.4 g/dL (ref 3.6–5.1)
Alkaline phosphatase (APISO): 51 U/L (ref 40–115)
BUN: 13 mg/dL (ref 7–25)
CO2: 28 mmol/L (ref 20–32)
Calcium: 9.3 mg/dL (ref 8.6–10.3)
Chloride: 101 mmol/L (ref 98–110)
Creat: 0.76 mg/dL (ref 0.70–1.33)
GFR, Est African American: 116 mL/min/{1.73_m2} (ref >=60)
GFR, Est Non African American: 100 mL/min/{1.73_m2} (ref >=60)
Globulin: 2.4 g/dL (calc) (ref 1.9–3.7)
Glucose, Bld: 91 mg/dL (ref 65–99)
Potassium: 4 mmol/L (ref 3.5–5.3)
Sodium: 137 mmol/L (ref 135–146)
Total Bilirubin: 0.7 mg/dL (ref 0.2–1.2)
Total Protein: 6.8 g/dL (ref 6.1–8.1)

## 2018-03-03 LAB — CBC WITH DIFFERENTIAL/PLATELET
Basophils Absolute: 42 cells/uL (ref 0–200)
Basophils Relative: 0.7 %
Eosinophils Absolute: 120 cells/uL (ref 15–500)
Eosinophils Relative: 2 %
HCT: 44.4 % (ref 38.5–50.0)
Hemoglobin: 15.6 g/dL (ref 13.2–17.1)
Lymphs Abs: 1392 cells/uL (ref 850–3900)
MCH: 30.9 pg (ref 27.0–33.0)
MCHC: 35.1 g/dL (ref 32.0–36.0)
MCV: 87.9 fL (ref 80.0–100.0)
MPV: 9.6 fL (ref 7.5–12.5)
Monocytes Relative: 6.3 %
Neutro Abs: 4068 cells/uL (ref 1500–7800)
Neutrophils Relative %: 67.8 %
Platelets: 225 10*3/uL (ref 140–400)
RBC: 5.05 10*6/uL (ref 4.20–5.80)
RDW: 12.7 % (ref 11.0–15.0)
Total Lymphocyte: 23.2 %
WBC mixed population: 378 cells/uL (ref 200–950)
WBC: 6 10*3/uL (ref 3.8–10.8)

## 2018-03-03 LAB — LIPID PANEL
Cholesterol: 194 mg/dL (ref <200)
HDL: 72 mg/dL (ref >40)
LDL Cholesterol (Calc): 108 mg/dL (calc) — ABNORMAL HIGH
Non-HDL Cholesterol (Calc): 122 mg/dL (calc) (ref <130)
Total CHOL/HDL Ratio: 2.7 (calc) (ref <5.0)
Triglycerides: 46 mg/dL (ref <150)

## 2018-03-03 LAB — PSA: PSA: 0.5 ng/mL (ref <=4.0)

## 2018-03-09 ENCOUNTER — Other Ambulatory Visit: Payer: BLUE CROSS/BLUE SHIELD | Admitting: Internal Medicine

## 2018-03-10 ENCOUNTER — Ambulatory Visit (INDEPENDENT_AMBULATORY_CARE_PROVIDER_SITE_OTHER): Payer: BLUE CROSS/BLUE SHIELD | Admitting: Internal Medicine

## 2018-03-10 ENCOUNTER — Encounter: Payer: Self-pay | Admitting: Internal Medicine

## 2018-03-10 VITALS — HR 78 | Temp 98.1°F | Ht 72.0 in | Wt 193.0 lb

## 2018-03-10 DIAGNOSIS — Z23 Encounter for immunization: Secondary | ICD-10-CM

## 2018-03-10 DIAGNOSIS — Z0000 Encounter for general adult medical examination without abnormal findings: Secondary | ICD-10-CM | POA: Diagnosis not present

## 2018-03-10 DIAGNOSIS — E7800 Pure hypercholesterolemia, unspecified: Secondary | ICD-10-CM

## 2018-03-10 LAB — POCT URINALYSIS DIPSTICK
Appearance: NORMAL
Bilirubin, UA: NEGATIVE
Blood, UA: NEGATIVE
Glucose, UA: NEGATIVE
Ketones, UA: NEGATIVE
Leukocytes, UA: NEGATIVE
Nitrite, UA: NEGATIVE
Odor: NORMAL
Protein, UA: NEGATIVE
Spec Grav, UA: 1.01 (ref 1.010–1.025)
Urobilinogen, UA: 0.2 E.U./dL
pH, UA: 6 (ref 5.0–8.0)

## 2018-03-10 NOTE — Progress Notes (Signed)
Subjective:    Patient ID: Joel Coffey, male    DOB: Jun 15, 1960, 57 y.o.   MRN: [id number]  HPI 57 year old Male for health maintenance exam and evaluation of medical issues. General health is excellent. Works out regularly. No new complaints or issues. Flu vaccine given. Is on Zocor for pure hypercholesterolemia.Lipid panel normal except LDL is 108.PSA is normal.  He has been on lipid-lowering medication since 2010.  No known drug allergies.  Had colonoscopy 2011 with repeat study due 2021.  History of migraine headaches, mitral regurgitation, allergic rhinitis.  Seldom has headaches.  At one point there was a question of Augmentin allergy but he subsequently went to an allergist and had formal testing for penicillin allergy and was found not to have penicillin allergy.  Past medical history: Vasectomy 2002.  Allergy testing done in 2009 showed very strong reactions to multiple grasses and tree pollens.  He had mild reactivity to selected molds, dust mite, cat dander and cockroach.  History of scrotal pain in 2004 and was treated for presumed epididymitis.  Old records indicate he had possible irritable bowel syndrome treated with Nu-Lev but currently is not an issue.  History of cardiac murmur dating back to the 1990s.  He has been told he does not need SBE prophylaxis.  Dr. Verl Blalock saw him in 2007, ordered a 2D echocardiogram showing trivial mitral valvular regurgitation.  No evidence of mitral stenosis or mitral valve prolapse.  Overall left ventricular systolic function was normal.  Social history: He is married.  2 adult children a son and a daughter.  He is a Scientific laboratory technician.  Daughter now attending graduate school in social work at [employer].  Wife is a Sports coach school professor [employer] of [employer].  Wife has history of breast cancer.  Son is a Ship broker at [employer].  Patient is a non-smoker.  Social alcohol consumption.  Family history: Father died at  age 59 of lymphoma.  Mother died May 01, 2016 at age 71 with complications of dementia.  One brother died with ruptured esophagus.  3 other brothers alive in good health.  No sisters.          Review of Systems  Constitutional: Negative.   All other systems reviewed and are negative.      Objective:   Physical Exam  Constitutional: He is oriented to person, place, and time. He appears well-developed and well-nourished.  HENT:  Head: Normocephalic and atraumatic.  Right Ear: External ear normal.  Left Ear: External ear normal.  Mouth/Throat: Oropharynx is clear and moist. No oropharyngeal exudate.  Eyes: Pupils are equal, round, and reactive to light. Conjunctivae are normal. Right eye exhibits no discharge. Left eye exhibits no discharge. No scleral icterus.  Neck: Neck supple. No JVD present. No thyromegaly present.  Cardiovascular: Normal rate and regular rhythm.  Murmur heard. 1/6 systolic ejection murmur heard best along left sternal border  Pulmonary/Chest: Effort normal and breath sounds normal. No respiratory distress. He has no wheezes. He has no rales.  Abdominal: Soft. He exhibits no distension and no mass. There is no tenderness. There is no rebound and no guarding.  Genitourinary: Prostate normal.  Musculoskeletal: He exhibits no edema.  Lymphadenopathy:    He has no cervical adenopathy.  Neurological: He is alert and oriented to person, place, and time. He displays normal reflexes. No cranial nerve deficit or sensory deficit. He exhibits normal muscle tone. Coordination normal.  Skin: Skin is warm and  dry.  Psychiatric: He has a normal mood and affect. His behavior is normal. Judgment and thought content normal.  Vitals reviewed.         Assessment & Plan:  Pure hypercholesterolemia-patient on statin therapy.  LDL 108-watch diet and continue exercise  History of trivial mitral valvular regurgitation  History of allergic rhinitis  Plan: Continue  current medications.  Other lab work reviewed and is entirely within normal limits.  Return in 1 year or as needed.  Flu vaccine given.  Allergic rhinitis

## 2018-03-13 ENCOUNTER — Emergency Department (HOSPITAL_COMMUNITY): Payer: BLUE CROSS/BLUE SHIELD

## 2018-03-13 ENCOUNTER — Emergency Department (HOSPITAL_COMMUNITY)
Admission: EM | Admit: 2018-03-13 | Discharge: 2018-03-13 | Disposition: A | Discharge status: Home / Self Care | Payer: BLUE CROSS/BLUE SHIELD | Attending: Emergency Medicine | Admitting: Emergency Medicine

## 2018-03-13 ENCOUNTER — Encounter (HOSPITAL_COMMUNITY): Payer: Self-pay | Admitting: *Deleted

## 2018-03-13 ENCOUNTER — Telehealth: Payer: Self-pay | Admitting: Internal Medicine

## 2018-03-13 ENCOUNTER — Encounter: Payer: Self-pay | Admitting: Internal Medicine

## 2018-03-13 DIAGNOSIS — R4182 Altered mental status, unspecified: Secondary | ICD-10-CM | POA: Diagnosis not present

## 2018-03-13 DIAGNOSIS — Z79899 Other long term (current) drug therapy: Secondary | ICD-10-CM | POA: Diagnosis not present

## 2018-03-13 DIAGNOSIS — I10 Essential (primary) hypertension: Secondary | ICD-10-CM | POA: Insufficient documentation

## 2018-03-13 DIAGNOSIS — R410 Disorientation, unspecified: Secondary | ICD-10-CM | POA: Insufficient documentation

## 2018-03-13 DIAGNOSIS — I6523 Occlusion and stenosis of bilateral carotid arteries: Secondary | ICD-10-CM | POA: Diagnosis not present

## 2018-03-13 LAB — COMPREHENSIVE METABOLIC PANEL
ALT: 33 U/L (ref 0–44)
AST: 35 U/L (ref 15–41)
Albumin: 4.4 g/dL (ref 3.5–5.0)
Alkaline Phosphatase: 59 U/L (ref 38–126)
Anion gap: 14 (ref 5–15)
BUN: 8 mg/dL (ref 6–20)
CO2: 22 mmol/L (ref 22–32)
Calcium: 9.6 mg/dL (ref 8.9–10.3)
Chloride: 100 mmol/L (ref 98–111)
Creatinine, Ser: 0.87 mg/dL (ref 0.61–1.24)
GFR calc Af Amer: >60 mL/min (ref >60)
GFR calc non Af Amer: >60 mL/min (ref >60)
Glucose, Bld: 141 mg/dL — ABNORMAL HIGH (ref 70–99)
Potassium: 2.9 mmol/L — ABNORMAL LOW (ref 3.5–5.1)
Sodium: 136 mmol/L (ref 135–145)
Total Bilirubin: 1 mg/dL (ref 0.3–1.2)
Total Protein: 7.7 g/dL (ref 6.5–8.1)

## 2018-03-13 LAB — DIFFERENTIAL
Abs Immature Granulocytes: 0.02 10*3/uL (ref 0.00–0.07)
Basophils Absolute: 0.1 10*3/uL (ref 0.0–0.1)
Basophils Relative: 1 %
Eosinophils Absolute: 0.2 10*3/uL (ref 0.0–0.5)
Eosinophils Relative: 2 %
Immature Granulocytes: 0 %
Lymphocytes Relative: 32 %
Lymphs Abs: 2.5 10*3/uL (ref 0.7–4.0)
Monocytes Absolute: 0.7 10*3/uL (ref 0.1–1.0)
Monocytes Relative: 9 %
Neutro Abs: 4.4 10*3/uL (ref 1.7–7.7)
Neutrophils Relative %: 56 %

## 2018-03-13 LAB — RAPID URINE DRUG SCREEN, HOSP PERFORMED
Amphetamines: NOT DETECTED
Barbiturates: NOT DETECTED
Benzodiazepines: NOT DETECTED
Cocaine: NOT DETECTED
Opiates: NOT DETECTED
Tetrahydrocannabinol: NOT DETECTED

## 2018-03-13 LAB — CBC
HCT: 46.6 % (ref 39.0–52.0)
Hemoglobin: 16.5 g/dL (ref 13.0–17.0)
MCH: 30.7 pg (ref 26.0–34.0)
MCHC: 35.4 g/dL (ref 30.0–36.0)
MCV: 86.8 fL (ref 80.0–100.0)
Platelets: 297 10*3/uL (ref 150–400)
RBC: 5.37 MIL/uL (ref 4.22–5.81)
RDW: 12 % (ref 11.5–15.5)
WBC: 7.8 10*3/uL (ref 4.0–10.5)
nRBC: 0 % (ref 0.0–0.2)

## 2018-03-13 LAB — APTT: aPTT: 26 seconds (ref 24–36)

## 2018-03-13 LAB — URINALYSIS, ROUTINE W REFLEX MICROSCOPIC
Bilirubin Urine: NEGATIVE
Glucose, UA: NEGATIVE mg/dL
Hgb urine dipstick: NEGATIVE
Ketones, ur: NEGATIVE mg/dL
Leukocytes, UA: NEGATIVE
Nitrite: NEGATIVE
Protein, ur: NEGATIVE mg/dL
Specific Gravity, Urine: 1.012 (ref 1.005–1.030)
pH: 7 (ref 5.0–8.0)

## 2018-03-13 LAB — I-STAT CHEM 8, ED
BUN: 9 mg/dL (ref 6–20)
Calcium, Ion: 1.09 mmol/L — ABNORMAL LOW (ref 1.15–1.40)
Chloride: 101 mmol/L (ref 98–111)
Creatinine, Ser: 0.8 mg/dL (ref 0.61–1.24)
Glucose, Bld: 143 mg/dL — ABNORMAL HIGH (ref 70–99)
HCT: 48 % (ref 39.0–52.0)
Hemoglobin: 16.3 g/dL (ref 13.0–17.0)
Potassium: 3 mmol/L — ABNORMAL LOW (ref 3.5–5.1)
Sodium: 137 mmol/L (ref 135–145)
TCO2: 23 mmol/L (ref 22–32)

## 2018-03-13 LAB — CBG MONITORING, ED: Glucose-Capillary: 145 mg/dL — ABNORMAL HIGH (ref 70–99)

## 2018-03-13 LAB — I-STAT TROPONIN, ED: Troponin i, poc: 0 ng/mL (ref 0.00–0.08)

## 2018-03-13 LAB — PROTIME-INR
INR: 0.95
Prothrombin Time: 12.6 seconds (ref 11.4–15.2)

## 2018-03-13 MED ORDER — IOPAMIDOL (ISOVUE-370) INJECTION 76%
50.0000 mL | Freq: Once | INTRAVENOUS | Status: AC
  Administered 2018-03-13: 50 mL via INTRAVENOUS

## 2018-03-13 MED ORDER — IOPAMIDOL (ISOVUE-370) INJECTION 76%
INTRAVENOUS | Status: AC
  Filled 2018-03-13: qty 50

## 2018-03-13 MED ORDER — LACTATED RINGERS IV BOLUS
1000.0000 mL | Freq: Once | INTRAVENOUS | Status: AC
  Administered 2018-03-13: 1000 mL via INTRAVENOUS

## 2018-03-13 NOTE — ED Triage Notes (Signed)
Pt reports he was leaving the gym this morning and couldn't remember the way to go home, he felt very confused, drove self here, alert and oriented to person, remembers driving here but unable to answer month or upcoming holidays, no focal deficits noted to arms to legs

## 2018-03-13 NOTE — ED Notes (Signed)
Due to HR of 167 pt taken to room for MD evaluation and clearance prior to CT.

## 2018-03-13 NOTE — Telephone Encounter (Signed)
Attempted to call pt. No answer. Left message. Seen in ED earlier today with acute confusional state. K noted to be 2.9 which I doubt is related to confusion. Pt just had CPE here earlier in the week. Fasting labs drawn before CPE showed K of 4.0  We will request Neurology consult. Advise take ASA 325 mg daily. Wonder if this was Transient Global Amnesia.

## 2018-03-13 NOTE — Patient Instructions (Signed)
It was a pleasure to see you today.  Continue same medications and return in 1 year or as needed.  Flu vaccine given.

## 2018-03-13 NOTE — Code Documentation (Signed)
57 yo Male coming from working out with complaints of sudden onset of confusion. Pt reports that he woke up at 0530 at his baseline. He went to a work out class at after the class at 0700 he started to feel confused with some dizziness. Pt arrived to the ED and was activated a Code Stroke by nursing staff. Stroke Team met patient in his room. Initial NIHSS 1 due to inability to know the month. Pt reported his name, work place, and year. No focal deficits noted upon exam. EDP came in to evlaute and reported that he did not want to activate the Code Stroke. Patient was not taken to CT, and Code Stroke was cancelled. Kehinde, RN to continue to monitor the patient.

## 2018-03-13 NOTE — ED Provider Notes (Signed)
[facility] Provider Note   CSN: [id number] Arrival date & time: 03/13/18  7673     History   Chief Complaint Chief Complaint  Patient presents with  . Altered Mental Status    HPI Joel Coffey is a 57 y.o. male. With HLD, GERD, seasonal allergies who presents for evaluation of altered mental status. He has been in his usual state of health until this morning. He was leaving the gym and couldn't remember the way to go home. (He and his wife moved 6 months ago). He felt very confused with associated dizziness. He could not remember where he parked and had to "beep" his car in order to find it. He called his wife and drove himself here. He is alert and oriented to person, but not month or upcoming holidays. He cannot remember what medicines he takes. Wife is at bedside and contributes to the history. He goes to the gym 2-3 times a week and did not work out excessively hard this morning. He has normal po intake. He denies prodromal or associated chest pain, palpitations, sob. Denies weakness or numbness. No recent illness, fevers, or chills.   He takes a statin, allergy pill, and a third medicine that he nor his wife can remember. He drinks beer, denies heavy drinking last night. Denies tobacco or illicit drug use.   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    HPI  Past Medical History:  Diagnosis Date  . Hyperlipidemia   . Migraine headache   . Mitral regurgitation   . Vitamin D deficiency     Patient Active Problem List   Diagnosis Date Noted  . Hyperlipidemia 03/04/2011  . Allergic rhinitis 03/04/2011  . Vitamin D deficiency 03/04/2011  . Headache, classical migraine 03/04/2011  . Mitral regurgitation 03/04/2011    Past Surgical History:  Procedure Laterality Date  . HERNIA REPAIR    . VASECTOMY          Home  Medications    Prior to Admission medications   Medication Sig Start Date End Date Taking? Authorizing Provider  ibuprofen (ADVIL,MOTRIN) 800 MG tablet TAKE 1 TABLET(800 MG) BY MOUTH EVERY 8 HOURS AS NEEDED FOR PAIN Patient taking differently: 800 mg every 8 (eight) hours as needed for moderate pain.  01/28/18  Yes Baxley, Cresenciano Lick, MD  loratadine (CLARITIN) 10 MG tablet Take 10 mg by mouth daily.   Yes [provider]  Multiple Vitamins-Minerals (MULTIVITAMIN,TX-MINERALS) tablet Take 1 tablet by mouth daily.     Yes [provider]  pantoprazole (PROTONIX) 40 MG tablet TAKE 1 TABLET BY MOUTH DAILY Patient taking differently: Take 40 mg by mouth daily.  02/24/18  Yes Baxley, Cresenciano Lick, MD  simvastatin (ZOCOR) 10 MG tablet Take 1 tablet (10 mg total) by mouth daily. 12/31/17  Yes Baxley, Cresenciano Lick, MD  fluticasone (FLONASE) 50 MCG/ACT nasal spray Place 2 sprays into both nostrils daily. Patient not taking: Reported on 03/10/2018 01/31/18   Kennyth Arnold, FNP    Family History Family History  Problem Relation Age of Onset  . Cancer Father     Social History Social History   Tobacco Use  . Smoking status: Never Smoker  . Smokeless tobacco: Never Used  Substance Use Topics  . Alcohol use: Yes  . Drug use: No     Allergies  Amoxicillin-pot clavulanate   Review of Systems Review of Systems See HPI  Physical Exam Updated Vital Signs BP (!) 167/95   Pulse 100   Temp (!) 97.5 F (36.4 C) (Oral)   Resp 15   SpO2 97%   Physical Exam Vitals:   03/13/18 1015 03/13/18 1030 03/13/18 1045 03/13/18 1115  BP: (!) 164/94 (!) 153/95 (!) 167/94 (!) 167/95  Pulse: 87 90 94 100  Resp: 17 16 14 15   Temp:      TempSrc:      SpO2: 96% 97% 98% 97%   General: Vital signs reviewed.  Patient is well-developed and well-nourished, in no acute distress and cooperative with exam.  Head: Normocephalic and atraumatic. Eyes: EOMI, conjunctivae normal, no scleral icterus.  Neck:  Supple, trachea midline, normal ROM, no JVD, masses, thyromegaly, or carotid bruit present.  Cardiovascular: tachycardic, regular rhythm, S1 normal, S2 normal, no murmurs, gallops, or rubs. Pulmonary/Chest: Clear to auscultation bilaterally, no wheezes, rales, or rhonchi. Abdominal: Soft, non-tender, non-distended, BS +, no masses, organomegaly, or guarding present.  Musculoskeletal: No joint deformities, erythema, or stiffness, ROM full and nontender. Extremities: No lower extremity edema bilaterally,  pulses symmetric and intact bilaterally. No cyanosis or clubbing. Neurological: A&O x3, Strength is normal and symmetric bilaterally, cranial nerve II-XII are grossly intact, no focal motor deficit, sensory intact to light touch bilaterally.  Skin: Warm, dry and intact. No rashes or erythema. Psychiatric: Anxious mood and affect. speech and behavior is normal. Cognition and memory are normal.    ED Treatments / Results  Labs (all labs ordered are listed, but only abnormal results are displayed) Labs Reviewed  COMPREHENSIVE METABOLIC PANEL - Abnormal; Notable for the following components:      Result Value   Potassium 2.9 (*)    Glucose, Bld 141 (*)    All other components within normal limits  URINALYSIS, ROUTINE W REFLEX MICROSCOPIC - Abnormal; Notable for the following components:   Color, Urine STRAW (*)    All other components within normal limits  CBG MONITORING, ED - Abnormal; Notable for the following components:   Glucose-Capillary 145 (*)    All other components within normal limits  I-STAT CHEM 8, ED - Abnormal; Notable for the following components:   Potassium 3.0 (*)    Glucose, Bld 143 (*)    Calcium, Ion 1.09 (*)    All other components within normal limits  PROTIME-INR  APTT  CBC  DIFFERENTIAL  RAPID URINE DRUG SCREEN, HOSP PERFORMED  I-STAT TROPONIN, ED    EKG EKG Interpretation  Date/Time:  Friday March 13 2018 07:30:20 EDT Ventricular Rate:  166 PR  Interval:    QRS Duration: 78 QT Interval:  300 QTC Calculation: 498 R Axis:   54 Text Interpretation:  Supraventricular tachycardia with occasional Premature ventricular complexes Nonspecific ST abnormality Abnormal ECG No old tracing to compare Confirmed by Merrily Pew [phone]) on 03/13/2018 8:06:27 AM   Radiology Ct Angio Head W Or Wo Contrast  Result Date: 03/13/2018 CLINICAL DATA:  Focal neuro deficit less than 6 hours. Confusion lightheaded. EXAM: CT ANGIOGRAPHY HEAD AND NECK TECHNIQUE: Multidetector CT imaging of the head and neck was performed using the standard protocol during bolus administration of intravenous contrast. Multiplanar CT image reconstructions and MIPs were obtained to evaluate the vascular anatomy. Carotid stenosis measurements (when applicable) are obtained utilizing NASCET criteria, using the distal internal carotid diameter as the denominator. CONTRAST:  21mL ISOVUE-370 IOPAMIDOL (ISOVUE-370) INJECTION 76% COMPARISON:  None. FINDINGS: CT HEAD  FINDINGS Brain: No evidence of acute infarction, hemorrhage, hydrocephalus, extra-axial collection or mass lesion/mass effect. Vascular: Negative for hyperdense vessel Skull: Negative Sinuses: Mild mucosal edema paranasal sinuses. Orbits: Normal orbit Review of the MIP images confirms the above findings CTA NECK FINDINGS Aortic arch: Normal Right carotid system: Normal right carotid system without stenosis Left carotid system: No significant left carotid stenosis. Mild atherosclerotic plaque in the left carotid bulb. Vertebral arteries: Normal vertebral arteries bilaterally. Both vertebral arteries patent to the basilar. Skeleton: Disc degeneration and spondylosis. No acute skeletal abnormality. Other neck: Negative Upper chest: Negative Review of the MIP images confirms the above findings CTA HEAD FINDINGS Anterior circulation: Cavernous carotid widely patent without stenosis or calcification. Anterior and middle cerebral arteries  widely patent Posterior circulation: Both vertebral arteries patent to the basilar. Right PICA patent. Left PICA. To be supplied from the left AICA. Superior cerebellar and posterior cerebral arteries patent bilaterally without stenosis. Venous sinuses: Patent Anatomic variants: None Delayed phase: Normal enhancement on delayed imaging. Review of the MIP images confirms the above findings IMPRESSION: No significant intracranial or extracranial stenosis. Negative for emergent large vessel occlusion. Early atherosclerotic disease left carotid bulb. Electronically Signed   By: Franchot Gallo M.D.   On: 03/13/2018 09:23   Dg Chest 2 View  Result Date: 03/13/2018 CLINICAL DATA:  Confusion, altered mental status EXAM: CHEST - 2 VIEW COMPARISON:  None. FINDINGS: Normal heart size and vascularity. Minor right base atelectasis over the diaphragm. No focal pneumonia, collapse or consolidation. Negative for edema, effusion or pneumothorax. Trachea is midline. Aorta atherosclerotic. IMPRESSION: Minor right base atelectasis versus scarring. Thoracic aortic atherosclerosis No other acute chest process by plain radiography Electronically Signed   By: Jerilynn Mages.  Shick M.D.   On: 03/13/2018 09:02   Ct Angio Neck W And/or Wo Contrast  Result Date: 03/13/2018 CLINICAL DATA:  Focal neuro deficit less than 6 hours. Confusion lightheaded. EXAM: CT ANGIOGRAPHY HEAD AND NECK TECHNIQUE: Multidetector CT imaging of the head and neck was performed using the standard protocol during bolus administration of intravenous contrast. Multiplanar CT image reconstructions and MIPs were obtained to evaluate the vascular anatomy. Carotid stenosis measurements (when applicable) are obtained utilizing NASCET criteria, using the distal internal carotid diameter as the denominator. CONTRAST:  14mL ISOVUE-370 IOPAMIDOL (ISOVUE-370) INJECTION 76% COMPARISON:  None. FINDINGS: CT HEAD FINDINGS Brain: No evidence of acute infarction, hemorrhage,  hydrocephalus, extra-axial collection or mass lesion/mass effect. Vascular: Negative for hyperdense vessel Skull: Negative Sinuses: Mild mucosal edema paranasal sinuses. Orbits: Normal orbit Review of the MIP images confirms the above findings CTA NECK FINDINGS Aortic arch: Normal Right carotid system: Normal right carotid system without stenosis Left carotid system: No significant left carotid stenosis. Mild atherosclerotic plaque in the left carotid bulb. Vertebral arteries: Normal vertebral arteries bilaterally. Both vertebral arteries patent to the basilar. Skeleton: Disc degeneration and spondylosis. No acute skeletal abnormality. Other neck: Negative Upper chest: Negative Review of the MIP images confirms the above findings CTA HEAD FINDINGS Anterior circulation: Cavernous carotid widely patent without stenosis or calcification. Anterior and middle cerebral arteries widely patent Posterior circulation: Both vertebral arteries patent to the basilar. Right PICA patent. Left PICA. To be supplied from the left AICA. Superior cerebellar and posterior cerebral arteries patent bilaterally without stenosis. Venous sinuses: Patent Anatomic variants: None Delayed phase: Normal enhancement on delayed imaging. Review of the MIP images confirms the above findings IMPRESSION: No significant intracranial or extracranial stenosis. Negative for emergent large vessel occlusion. Early atherosclerotic disease left carotid  bulb. Electronically Signed   By: Franchot Gallo M.D.   On: 03/13/2018 09:23    Procedures Procedures (including critical care time)  Medications Ordered in ED Medications  iopamidol (ISOVUE-370) 76 % injection (has no administration in time range)  iopamidol (ISOVUE-370) 76 % injection (has no administration in time range)  lactated ringers bolus 1,000 mL (0 mLs Intravenous Stopped 03/13/18 1030)  iopamidol (ISOVUE-370) 76 % injection 50 mL (50 mLs Intravenous Contrast Given 03/13/18 0828)      Initial Impression / Assessment and Plan / ED Course  I have reviewed the triage vital signs and the nursing notes.  Pertinent labs & imaging results that were available during my care of the patient were reviewed by me and considered in my medical decision making (see chart for details).     57yo male, relatively healthy. Presenting from the gym with confusion. Code stroke was called. He was evaluated by the stroke team and had no neurologic deficit. EKG shows narrow complex tachycardia with heart rate 166. He is otherwise stable. Denies chest pain, shortness of breath, weakness, numbness, and is speaking full sentences without difficulty. Wife is at bedside. Heart rate has decreased to 107. However, he remains hypertensive to 194/139. He is unsure about whether or not he is on any antihypertensive therapy at home. Per chart review, BP was 130/85 last month. Most concerned for possible stroke vs. Hypertensive encephalopathy. Will get CTA head and neck.   His glucose is 143. He drinks beer but denies excessive use last night and denies any other substance use. He is afebrile, normal WBC, lungs are CTAB, no dysuria. Doubt infection. He is hypokalemic to 3.0 but other electrolytes are normal. Hypokalemia is most likely due to elevated B-adrenergic activity from his recent exercise and stress. He denies GI losses. He is not on a diuretic and previous labs show normal K+.   Repeat EKG is sinus rhythm with heart rate 89. BP is 181/98. Repeat neurologic exam is normal. CTA head and neck are normal.   Patient is back to baseline, although remains hypertensive but is asymptomatic. This episode of hypertension with confusion without prior hypertension raises concern for secondary hypertension. I doubt pheochromocytoma because his episode was not associated with headache, palpitations, or sweating. No evidence of primary kidney disease with normal BMP. He is hypokalemic which could suggest primary  aldosteronism but he has not been hypokalemic on previous labs. No murmur to suggest coarctation of the aorta.   He was monitored in the ED and remained back to baseline. Tolerated po intake and was able to ambulate without difficulty. His BP has improved to 150s/90s without intervention and he remains asymptomatic. He is safe to discharge with close PCP follow up for hypertension and hypokalemia. Given return precautions    Final Clinical Impressions(s) / ED Diagnoses   Final diagnoses:  Transient confusion  Hypertension, unspecified type    ED Discharge Orders    None       Isabelle Course, MD 03/13/18 1254    Mesner, Corene Cornea, MD 03/13/18 [id number]

## 2018-03-13 NOTE — ED Notes (Signed)
CALLED [facility] FOR CODE STROKE ACTIVATION

## 2018-03-13 NOTE — Discharge Instructions (Addendum)
We did not find a reason for your high blood pressure but I would like you to follow up with your primary doctor for more evaluation. I do not want to start you on any blood pressure medication since your blood pressure has improved in the ED without any medicines.   Your potassium was a little low. This could be a normal consequence of recent exercise and stress. Please follow up with your regular doctor to get it checked again and make sure it's back to normal.   Please look through the educational material included and pay special attention to the "Get help right away if" section.

## 2018-03-13 NOTE — ED Notes (Signed)
Pt's glucose was 145, informed Mesner-MD and Tray-RN.

## 2018-03-13 NOTE — ED Provider Notes (Signed)
I saw and evaluated the patient, reviewed the resident's note and I agree with the findings and plan with the following exceptions.   57 year old male the presents to the emergency department today and on fashion.  Patient states that he was at the gym and doing normal workouts and when he walked out he was slightly confused and over his car was.  He started driving and realized he did not know for sure how to get home however he just moved 6 months ago so blamed on that but because of the episodic confusion present the emergency department.  On initial vital signs here patient was very tachycardic and hypertensive and still disoriented to time.  Had no other neurologic deficits.  Code stroke was called and the triage but this was canceled by myself and Dr. Cheral Marker after quick evaluation.  Once again his vital signs were otherwise within normal limits.  He.  Otherwise well.  He had a normal neurologic exam.  CTA of his head and neck were done and a 1 L bolus of fluids were given secondary to the heart rate this self resolved into the 80s.  His blood pressure came down to the 150s 160s without intervention.  He was just seen a few days ago with a blood pressure of 425 systolic.  I wonder if this is something like a pheochromocytoma or other adrenal issue however only other supporting sign is that he has a slightly low potassium.  Still considered posterior reversible encephalopathic syndrome however he was not really is encephalopathic he just had mild confusion.  Also consider hypertensive emergency however it did not really fit with a global brain issue.  No evidence of stroke on his CTA of head and neck.  Could be some variant of a transient global amnesia or alteration of awareness.  Unclear at this time will need to follow-up with primary doctor for further management but is monitored in the emergency department for multiple hours and was found to build to eat and drink without abnormality and was able to  ambulate without difficulty was at his baseline for multiple hours.   EKG Interpretation  Date/Time:  Friday March 13 2018 07:30:20 EDT Ventricular Rate:  166 PR Interval:    QRS Duration: 78 QT Interval:  300 QTC Calculation: 498 R Axis:   54 Text Interpretation:  Supraventricular tachycardia with occasional Premature ventricular complexes Nonspecific ST abnormality Abnormal ECG No old tracing to compare Confirmed by Merrily Pew [phone]) on 03/13/2018 8:06:27 AM         Xiadani Damman, Corene Cornea, MD 03/13/18 1635

## 2018-03-13 NOTE — ED Notes (Addendum)
Patient transported to CT and then to X-ray  

## 2018-03-13 NOTE — ED Notes (Signed)
Canceled code stroke with [facility]

## 2018-03-19 ENCOUNTER — Encounter: Payer: Self-pay | Admitting: Internal Medicine

## 2018-03-19 ENCOUNTER — Ambulatory Visit (INDEPENDENT_AMBULATORY_CARE_PROVIDER_SITE_OTHER): Payer: BLUE CROSS/BLUE SHIELD | Admitting: Internal Medicine

## 2018-03-19 VITALS — BP 110/70 | HR 71 | Ht 72.0 in | Wt 194.5 lb

## 2018-03-19 DIAGNOSIS — E7800 Pure hypercholesterolemia, unspecified: Secondary | ICD-10-CM | POA: Diagnosis not present

## 2018-03-19 DIAGNOSIS — Z79899 Other long term (current) drug therapy: Secondary | ICD-10-CM | POA: Diagnosis not present

## 2018-03-19 DIAGNOSIS — G454 Transient global amnesia: Secondary | ICD-10-CM

## 2018-03-19 DIAGNOSIS — F439 Reaction to severe stress, unspecified: Secondary | ICD-10-CM
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
## 2018-03-22 NOTE — Patient Instructions (Signed)
To have neurology evaluation in the near future.  Continue statin medication.  Light exercise for now.

## 2018-03-26 ENCOUNTER — Encounter: Payer: Self-pay | Admitting: Neurology

## 2018-03-26 ENCOUNTER — Ambulatory Visit: Payer: BLUE CROSS/BLUE SHIELD | Admitting: Neurology

## 2018-03-26 ENCOUNTER — Other Ambulatory Visit: Payer: Self-pay

## 2018-03-26 VITALS — BP 148/94 | HR 78 | Resp 16 | Ht 72.0 in | Wt 199.0 lb

## 2018-03-26 DIAGNOSIS — G454 Transient global amnesia: Secondary | ICD-10-CM

## 2018-03-26 DIAGNOSIS — R404 Transient alteration of awareness: Secondary | ICD-10-CM

## 2018-03-26 NOTE — Progress Notes (Signed)
Reason for visit: Transient amnesia  Referring physician: Dr. Arie Sabina is a 57 y.o. male  History of present illness:  Joel Coffey is a 57 year old right-handed white male with a history of an event that occurred on 13 March 2018.  The patient had gone into the [employer] to work out early in the morning, he felt fine initially but during the workout he started to feel zoned out as if he had drank too much alcohol.  He started to have onset of a patchy recollection of events around 7 AM, he realized that he was not doing well and decided to go to the emergency room.  He does not recall driving to the ER, he does have patchy recollection going to his car.  He apparently called his wife which he does not remember, she met him at the emergency room.  The patient appeared to look pale initially, he was noted to have an elevated blood pressure of 181/98 with a heart rate of 166.  The patient underwent a CT angiogram of the head and neck that included a CT of the head, no significant abnormalities were noted.  The patient eventually started clearing with his memory around 12 noon, he went about 5 hours without much in the way of any recollection of events.  The patient reported no headache, he denies any dizziness, he denies numbness or weakness of the extremities.  He has not had any events before this or since.  He does give a history of migraine headaches when he was younger, he has not had any migraine in over 15 years.  He is sent to this office for an evaluation.  Past Medical History:  Diagnosis Date  . Hyperlipidemia   . Migraine headache   . Mitral regurgitation   . Vision abnormalities   . Vitamin D deficiency     Past Surgical History:  Procedure Laterality Date  . HERNIA REPAIR    . VASECTOMY      Family History  Problem Relation Age of Onset  . Cancer Father     Social history:  reports that he has never smoked. He has never used smokeless tobacco. He reports that he  drinks alcohol. He reports that he does not use drugs.  Medications:  Prior to Admission medications   Medication Sig Start Date End Date Taking? Authorizing Provider  ibuprofen (ADVIL,MOTRIN) 800 MG tablet TAKE 1 TABLET(800 MG) BY MOUTH EVERY 8 HOURS AS NEEDED FOR PAIN Patient taking differently: 800 mg every 8 (eight) hours as needed for moderate pain.  01/28/18  Yes Baxley, Cresenciano Lick, MD  loratadine (CLARITIN) 10 MG tablet Take 10 mg by mouth daily.   Yes [provider]  Multiple Vitamins-Minerals (MULTIVITAMIN,TX-MINERALS) tablet Take 1 tablet by mouth daily.     Yes [provider]  pantoprazole (PROTONIX) 40 MG tablet TAKE 1 TABLET BY MOUTH DAILY Patient taking differently: Take 40 mg by mouth daily.  02/24/18  Yes Baxley, Cresenciano Lick, MD  simvastatin (ZOCOR) 10 MG tablet Take 1 tablet (10 mg total) by mouth daily. 12/31/17  Yes Baxley, Cresenciano Lick, MD  fluticasone (FLONASE) 50 MCG/ACT nasal spray Place 2 sprays into both nostrils daily. Patient not taking: Reported on 03/26/2018 01/31/18   Kennyth Arnold, FNP      Allergies  Allergen Reactions  . Amoxicillin-Pot Clavulanate     REACTION: hives    ROS:  Out of a complete 14 system review of symptoms, the patient complains  only of the following symptoms, and all other reviewed systems are negative.  Transient amnesia, confusion  Blood pressure (!) 148/94, pulse 78, resp. rate 16, height 6' (1.829 m), weight 199 lb (90.3 kg).  Physical Exam  General: The patient is alert and cooperative at the time of the examination.  Eyes: Pupils are equal, round, and reactive to light. Discs are flat bilaterally.  Neck: The neck is supple, no carotid bruits are noted.  Respiratory: The respiratory examination is clear.  Cardiovascular: The cardiovascular examination reveals a regular rate and rhythm, no obvious murmurs or rubs are noted.  Skin: Extremities are without significant edema.  Neurologic Exam  Mental status: The  patient is alert and oriented x 3 at the time of the examination. The patient has apparent normal recent and remote memory, with an apparently normal attention span and concentration ability.  Cranial nerves: Facial symmetry is present. There is good sensation of the face to pinprick and soft touch bilaterally. The strength of the facial muscles and the muscles to head turning and shoulder shrug are normal bilaterally. Speech is well enunciated, no aphasia or dysarthria is noted. Extraocular movements are full. Visual fields are full. The tongue is midline, and the patient has symmetric elevation of the soft palate. No obvious hearing deficits are noted.  Motor: The motor testing reveals 5 over 5 strength of all 4 extremities. Good symmetric motor tone is noted throughout.  Sensory: Sensory testing is intact to pinprick, soft touch, vibration sensation, and position sense on all 4 extremities. No evidence of extinction is noted.  Coordination: Cerebellar testing reveals good finger-nose-finger and heel-to-shin bilaterally.  Gait and station: Gait is normal. Tandem gait is normal. Romberg is negative. No drift is seen.  Reflexes: Deep tendon reflexes are symmetric and normal bilaterally. Toes are downgoing bilaterally.    CTA head and neck 03/13/18:  IMPRESSION: No significant intracranial or extracranial stenosis. Negative for emergent large vessel occlusion. Early atherosclerotic disease left carotid bulb.    Assessment/Plan:  1.  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  The patient will be sent for MRI of the brain, he will have an EEG study.  He is already on low-dose aspirin.  The patient will contact me if another event is noted.  I will call him with results of the above  studies.   Joel Alexanders MD 03/26/2018 1:57 PM  [facility] [address] [place], [place] [address]  Phone [phone] Fax [phone]

## 2018-03-27 ENCOUNTER — Telehealth: Payer: Self-pay | Admitting: Neurology

## 2018-03-27 NOTE — Telephone Encounter (Signed)
lvm for pt to call back about scheduling mri  [employer] Auth: [place] via [employer]

## 2018-03-27 NOTE — Telephone Encounter (Signed)
Patient returned my call he is scheduled for 03/31/18 at [facility].

## 2018-03-31 ENCOUNTER — Other Ambulatory Visit: Payer: Self-pay | Admitting: Internal Medicine

## 2018-03-31 ENCOUNTER — Ambulatory Visit: Payer: BLUE CROSS/BLUE SHIELD

## 2018-03-31 DIAGNOSIS — G454 Transient global amnesia: Secondary | ICD-10-CM | POA: Diagnosis not present

## 2018-03-31 DIAGNOSIS — R404 Transient alteration of awareness: Secondary | ICD-10-CM | POA: Diagnosis not present

## 2018-04-02 ENCOUNTER — Telehealth: Payer: Self-pay | Admitting: Neurology

## 2018-04-02 NOTE — Telephone Encounter (Signed)
  I called the patient.  MRI of the brain is unremarkable.  EEG study is pending.    MRI brain 04/01/18:  IMPRESSION:   Unremarkable MRI brain (without). No acute findings.

## 2018-04-14 ENCOUNTER — Telehealth: Payer: Self-pay | Admitting: Neurology

## 2018-04-14 ENCOUNTER — Ambulatory Visit: Payer: BLUE CROSS/BLUE SHIELD | Admitting: Neurology

## 2018-04-14 DIAGNOSIS — R4182 Altered mental status, unspecified: Secondary | ICD-10-CM | POA: Diagnosis not present

## 2018-04-14 DIAGNOSIS — R404 Transient alteration of awareness: Secondary | ICD-10-CM

## 2018-04-14 DIAGNOSIS — G454 Transient global amnesia: Secondary | ICD-10-CM

## 2018-04-14 NOTE — Telephone Encounter (Signed)
I called the patient. The EEG study was normal in the awake and sleeping state.

## 2018-04-14 NOTE — Procedures (Signed)
    History:  Joel Coffey is a 57 year old patient with a history of a transient global amnesia that occurred on 13 March 2018.  The event lasted about 5 hours with full resolution.  The patient is being evaluated for this episode.  This is a routine EEG.  No skull defects are noted.  Medications include ibuprofen, Claritin, multivitamins, Protonix, Zocor, and Flonase.  EEG classification: Normal awake and asleep  Description of the recording: The background rhythms of this recording consists of a fairly well modulated medium amplitude background activity of 9 Hz. As the record progresses, the patient initially is in the waking state, but appears to enter the early stage II sleep during the recording, with rudimentary sleep spindles and vertex sharp wave activity seen. During the wakeful state, photic stimulation is performed, and this results in a bilateral and symmetric photic driving response. Hyperventilation was also performed, and this results in a minimal buildup of the background rhythm activities without significant slowing seen. At no time during the recording does there appear to be evidence of spike or spike wave discharges or evidence of focal slowing. EKG monitor shows no evidence of cardiac rhythm abnormalities with a heart rate of 66.  Impression: This is a normal EEG recording in the waking and sleeping state. No evidence of ictal or interictal discharges were seen at any time during the recording.

## 2018-05-31 ENCOUNTER — Other Ambulatory Visit: Payer: Self-pay | Admitting: Internal Medicine

## 2018-07-03 ENCOUNTER — Ambulatory Visit (INDEPENDENT_AMBULATORY_CARE_PROVIDER_SITE_OTHER): Payer: BLUE CROSS/BLUE SHIELD | Admitting: Internal Medicine

## 2018-07-03 ENCOUNTER — Encounter: Payer: Self-pay | Admitting: Internal Medicine

## 2018-07-03 VITALS — BP 150/90 | HR 86 | Temp 98.5°F | Ht 72.0 in | Wt 199.0 lb

## 2018-07-03 DIAGNOSIS — F411 Generalized anxiety disorder: Secondary | ICD-10-CM | POA: Diagnosis not present

## 2018-07-03 DIAGNOSIS — J0100 Acute maxillary sinusitis, unspecified: Secondary | ICD-10-CM

## 2018-07-03 DIAGNOSIS — F439 Reaction to severe stress, unspecified: Secondary | ICD-10-CM | POA: Diagnosis not present

## 2018-07-03 MED ORDER — ESCITALOPRAM OXALATE 10 MG PO TABS: 10.0000 mg | ORAL_TABLET | Freq: Every day | ORAL | 1 refills | Status: DC

## 2018-07-03 MED ORDER — CLORAZEPATE DIPOTASSIUM 3.75 MG PO TABS: 3.7500 mg | ORAL_TABLET | Freq: Two times a day (BID) | ORAL | 0 refills | Status: DC | PRN

## 2018-07-03 MED ORDER — METHYLPREDNISOLONE ACETATE 80 MG/ML IJ SUSP
80.0000 mg | Freq: Once | INTRAMUSCULAR | Status: AC
  Administered 2018-07-03: 80 mg via INTRAMUSCULAR

## 2018-07-03 MED ORDER — AZITHROMYCIN 250 MG PO TABS: ORAL_TABLET | ORAL | 0 refills | Status: DC

## 2018-07-03 NOTE — Progress Notes (Signed)
   Subjective:    Patient ID: Joel Coffey, male    DOB: 02-24-1961, 58 y.o.   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  No fever or chills.  In October he had an episode of transient global amnesia.  He has had some situational stress with daughter.  He has noticed more anxiety and some issues concentrating.    Review of Systems in November he had MRI of the brain because of transient global amnesia.  No abnormalities were noted in his sinuses at that time.     Objective:   Physical Exam Blood pressure is elevated at 150/90.  He has no prior history of hypertension. Pharynx is clear.  TMs are clear.  Neck is supple.  Chest clear to auscultation.      Assessment & Plan:  Acute maxillary sinusitis  Anxiety state  Plan: He is going to try Lexapro 10 mg daily and Tranxene 3.75 mg twice daily as needed for anxiety.  Sinus films have been ordered.  He will be placed on a Zithromax Z-PAK.  Follow-up in mid March.  Depo-Medrol 80 mg IM given.  20 minutes spent with patient

## 2018-07-25 ENCOUNTER — Encounter: Payer: Self-pay | Admitting: Internal Medicine

## 2018-07-25 NOTE — Patient Instructions (Addendum)
Take Zithromax as directed.  Trial of Lexapro 10 mg daily and Tranxene 3.75 mg twice daily as needed for anxiety.  Have sinus x-rays.  Follow-up in March.  Depo-Medrol 80 mg IM given to decongest sinuses

## 2018-08-07 ENCOUNTER — Encounter: Payer: Self-pay | Admitting: Internal Medicine

## 2018-08-07 ENCOUNTER — Ambulatory Visit: Payer: BLUE CROSS/BLUE SHIELD | Admitting: Internal Medicine

## 2018-08-07 ENCOUNTER — Other Ambulatory Visit: Payer: Self-pay

## 2018-08-07 VITALS — BP 180/110 | HR 70 | Ht 72.0 in | Wt 194.0 lb

## 2018-08-07 DIAGNOSIS — F411 Generalized anxiety disorder: Secondary | ICD-10-CM | POA: Diagnosis not present

## 2018-08-07 DIAGNOSIS — R030 Elevated blood-pressure reading, without diagnosis of hypertension: Secondary | ICD-10-CM | POA: Diagnosis not present

## 2018-08-07 MED ORDER — ESCITALOPRAM OXALATE 10 MG PO TABS: 10.0000 mg | ORAL_TABLET | Freq: Every day | ORAL | 1 refills | Status: DC

## 2018-08-07 NOTE — Patient Instructions (Signed)
Obtain home blood pressure monitor and fax blood pressure readings to [place] in 2 weeks.  Continue medication for anxiety.

## 2018-08-07 NOTE — Progress Notes (Signed)
   Subjective:    Patient ID: Joel Coffey, male    DOB: 11/07/1960, 58 y.o.   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  Was having issues with anxiety.  He was treated with Zithromax, Depo-Medrol IM and started on Lexapro 10 mg daily and Tranxene 3.75 mg twice daily.  He is feeling much better.  His anxiety has lessened quite a bit.  However his blood pressure is elevated today.  At last visit his blood pressure was 150/90.  He has no prior history of hypertension.  He exercises regularly and watches his weight and salt intake.    Review of Systems see above     Objective:   Physical Exam  Blood pressure repeated 160/90 in each arm      Assessment & Plan:  Elevated blood pressure readings  Anxiety-improved with Lexapro and Tranxene  Plan: Continue Lexapro and Tranxene.  He will purchase a home blood pressure monitor and fax blood pressure readings to me in the next couple of weeks.

## 2018-08-19 ENCOUNTER — Other Ambulatory Visit: Payer: Self-pay

## 2018-08-19 MED ORDER — IBUPROFEN 800 MG PO TABS: ORAL_TABLET | ORAL | 0 refills | Status: DC

## 2018-08-19 NOTE — Telephone Encounter (Signed)
Patient is requesting a a refill on IBUPROFEN 800MG .

## 2018-09-01 ENCOUNTER — Other Ambulatory Visit: Payer: Self-pay | Admitting: Internal Medicine

## 2018-09-18 ENCOUNTER — Ambulatory Visit (INDEPENDENT_AMBULATORY_CARE_PROVIDER_SITE_OTHER): Payer: BLUE CROSS/BLUE SHIELD | Admitting: Internal Medicine

## 2018-09-18 ENCOUNTER — Encounter: Payer: Self-pay | Admitting: Internal Medicine

## 2018-09-18 VITALS — BP 151/90 | Ht 72.0 in | Wt 194.0 lb

## 2018-09-18 DIAGNOSIS — Z79899 Other long term (current) drug therapy: Secondary | ICD-10-CM

## 2018-09-18 DIAGNOSIS — F419 Anxiety disorder, unspecified: Secondary | ICD-10-CM

## 2018-09-18 DIAGNOSIS — I10 Essential (primary) hypertension: Secondary | ICD-10-CM | POA: Diagnosis not present

## 2018-09-18 MED ORDER — AMLODIPINE BESYLATE 5 MG PO TABS: 5.0000 mg | ORAL_TABLET | Freq: Every day | ORAL | 3 refills | Status: DC

## 2018-09-18 NOTE — Progress Notes (Signed)
   Subjective:    Patient ID: Joel Coffey, male    DOB: [birth year], 58 y.o.   MRN: [id number]  HPI 58 year old Male seen today by [employer] due to coronavirus pandemic.  Patient consents to this format of follow-up visit.  He is identified is Joel Coffey, a patient in this practice using 2 identifiers.  He has submitted multiple blood pressure readings in advance of visit. BP has been fluctuating.  Morning readings have ranged from 465 systolically to 681. Diastolic morning readings have ranged from 77 to 103.  Patient says work is going well.  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  He has a history of anxiety treated with Tranxene 3.75 mg twice daily and Lexapro 10 mg daily.  He thinks anxiety is fairly well controlled on this regimen.  He takes Protonix for GE reflux, Claritin for allergic rhinitis and simvastatin for hyperlipidemia.        Review of Systems no new complaints     Objective:   Physical Exam See above blood pressure readings  submitted prior to visit and will be scanned into chart  Blood pressure last night was 141/82 and this morning 151/93.       Assessment & Plan:  Labile blood pressure elevations  Anxiety state-seems to be under good control with current regimen of SSRI and Tranxene.  Plan: Begin amlodipine 5 mg daily and follow-up in 2 weeks.  Continue to monitor blood pressure twice daily.

## 2018-09-18 NOTE — Patient Instructions (Signed)
Continue SSRI.  Start Norvasc 5 mg daily and follow-up in 2 weeks.  Keep a record of blood pressures at home.

## 2018-10-29 ENCOUNTER — Other Ambulatory Visit: Payer: Self-pay

## 2018-10-29 MED ORDER — ESCITALOPRAM OXALATE 10 MG PO TABS: 10.0000 mg | ORAL_TABLET | Freq: Every day | ORAL | 11 refills | Status: DC

## 2018-12-03 ENCOUNTER — Other Ambulatory Visit: Payer: Self-pay | Admitting: Internal Medicine

## 2018-12-03 MED ORDER — IBUPROFEN 800 MG PO TABS: ORAL_TABLET | ORAL | 3 refills | Status: DC

## 2018-12-03 NOTE — Telephone Encounter (Signed)
Received Fax RX request from  Pharmacy - [facility]  Medication - ibuprofen (ADVIL,MOTRIN) 800 MG tablet    Last Refill - 08-19-18  Last OV - 09-18-18  Last CPE- 03-10-18  Next CPE scheduled 03-15-19

## 2019-03-04 ENCOUNTER — Telehealth: Payer: Self-pay | Admitting: Internal Medicine

## 2019-03-04 ENCOUNTER — Other Ambulatory Visit: Payer: Self-pay

## 2019-03-04 DIAGNOSIS — Z20828 Contact with and (suspected) exposure to other viral communicable diseases: Secondary | ICD-10-CM | POA: Diagnosis not present

## 2019-03-04 DIAGNOSIS — Z20822 Contact with and (suspected) exposure to covid-19: Secondary | ICD-10-CM

## 2019-03-04 MED ORDER — PANTOPRAZOLE SODIUM 40 MG PO TBEC: 40.0000 mg | DELAYED_RELEASE_TABLET | Freq: Every day | ORAL | 1 refills | Status: DC

## 2019-03-04 NOTE — Telephone Encounter (Signed)
Received Fax RX request from  [facility]  Medication - pantoprazole ([place]) 40 MG tablet   Last Refill - 12/07/18  Last OV - 09/18/18  Last CPE - 03/10/18  Next CPE scheduled for 03/15/19

## 2019-03-05 LAB — NOVEL CORONAVIRUS, NAA: SARS-CoV-2, NAA: NOT DETECTED

## 2019-03-11 ENCOUNTER — Other Ambulatory Visit: Payer: BC Managed Care – PPO | Admitting: Internal Medicine

## 2019-03-11 ENCOUNTER — Other Ambulatory Visit: Payer: Self-pay

## 2019-03-11 DIAGNOSIS — E7800 Pure hypercholesterolemia, unspecified: Secondary | ICD-10-CM

## 2019-03-11 DIAGNOSIS — I10 Essential (primary) hypertension: Secondary | ICD-10-CM | POA: Diagnosis not present

## 2019-03-11 DIAGNOSIS — Z0000 Encounter for general adult medical examination without abnormal findings: Secondary | ICD-10-CM | POA: Diagnosis not present

## 2019-03-11 DIAGNOSIS — Z125 Encounter for screening for malignant neoplasm of prostate: Secondary | ICD-10-CM

## 2019-03-11 DIAGNOSIS — F419 Anxiety disorder, unspecified: Secondary | ICD-10-CM

## 2019-03-12 LAB — COMPLETE METABOLIC PANEL WITH GFR
AG Ratio: 1.8 (calc) (ref 1.0–2.5)
ALT: 34 U/L (ref 9–46)
AST: 39 U/L — ABNORMAL HIGH (ref 10–35)
Albumin: 4.1 g/dL (ref 3.6–5.1)
Alkaline phosphatase (APISO): 53 U/L (ref 35–144)
BUN: 11 mg/dL (ref 7–25)
CO2: 28 mmol/L (ref 20–32)
Calcium: 9.1 mg/dL (ref 8.6–10.3)
Chloride: 102 mmol/L (ref 98–110)
Creat: 0.71 mg/dL (ref 0.70–1.25)
GFR, Est African American: 118 mL/min/{1.73_m2} (ref >=60)
GFR, Est Non African American: 102 mL/min/{1.73_m2} (ref >=60)
Globulin: 2.3 g/dL (calc) (ref 1.9–3.7)
Glucose, Bld: 91 mg/dL (ref 65–99)
Potassium: 4.1 mmol/L (ref 3.5–5.3)
Sodium: 138 mmol/L (ref 135–146)
Total Bilirubin: 0.6 mg/dL (ref 0.2–1.2)
Total Protein: 6.4 g/dL (ref 6.1–8.1)

## 2019-03-12 LAB — CBC WITH DIFFERENTIAL/PLATELET
Absolute Monocytes: 400 cells/uL (ref 200–950)
Basophils Absolute: 52 cells/uL (ref 0–200)
Basophils Relative: 1 %
Eosinophils Absolute: 99 cells/uL (ref 15–500)
Eosinophils Relative: 1.9 %
HCT: 44.3 % (ref 38.5–50.0)
Hemoglobin: 15.2 g/dL (ref 13.2–17.1)
Lymphs Abs: 1596 cells/uL (ref 850–3900)
MCH: 30.7 pg (ref 27.0–33.0)
MCHC: 34.3 g/dL (ref 32.0–36.0)
MCV: 89.5 fL (ref 80.0–100.0)
MPV: 9.5 fL (ref 7.5–12.5)
Monocytes Relative: 7.7 %
Neutro Abs: 3052 cells/uL (ref 1500–7800)
Neutrophils Relative %: 58.7 %
Platelets: 218 10*3/uL (ref 140–400)
RBC: 4.95 10*6/uL (ref 4.20–5.80)
RDW: 12.6 % (ref 11.0–15.0)
Total Lymphocyte: 30.7 %
WBC: 5.2 10*3/uL (ref 3.8–10.8)

## 2019-03-12 LAB — LIPID PANEL
Cholesterol: 189 mg/dL (ref <200)
HDL: 73 mg/dL (ref >=40)
LDL Cholesterol (Calc): 101 mg/dL (calc) — ABNORMAL HIGH
Non-HDL Cholesterol (Calc): 116 mg/dL (calc) (ref <130)
Total CHOL/HDL Ratio: 2.6 (calc) (ref <5.0)
Triglycerides: 60 mg/dL (ref <150)

## 2019-03-12 LAB — PSA: PSA: 0.4 ng/mL (ref <=4.0)

## 2019-03-15 ENCOUNTER — Encounter: Payer: Self-pay | Admitting: Internal Medicine

## 2019-03-15 ENCOUNTER — Other Ambulatory Visit: Payer: Self-pay

## 2019-03-15 ENCOUNTER — Ambulatory Visit (INDEPENDENT_AMBULATORY_CARE_PROVIDER_SITE_OTHER): Payer: BC Managed Care – PPO | Admitting: Internal Medicine

## 2019-03-15 VITALS — BP 140/90 | HR 80 | Temp 98.1°F | Ht 72.0 in | Wt 194.0 lb

## 2019-03-15 DIAGNOSIS — G454 Transient global amnesia: Secondary | ICD-10-CM | POA: Diagnosis not present

## 2019-03-15 DIAGNOSIS — Z0000 Encounter for general adult medical examination without abnormal findings: Secondary | ICD-10-CM

## 2019-03-15 DIAGNOSIS — Z79899 Other long term (current) drug therapy: Secondary | ICD-10-CM

## 2019-03-15 DIAGNOSIS — I10 Essential (primary) hypertension: Secondary | ICD-10-CM

## 2019-03-15 DIAGNOSIS — Z8659 Personal history of other mental and behavioral disorders: Secondary | ICD-10-CM

## 2019-03-15 DIAGNOSIS — E7800 Pure hypercholesterolemia, unspecified: Secondary | ICD-10-CM | POA: Diagnosis not present

## 2019-03-15 LAB — POCT URINALYSIS DIPSTICK
Appearance: NEGATIVE
Bilirubin, UA: NEGATIVE
Blood, UA: NEGATIVE
Glucose, UA: NEGATIVE
Ketones, UA: NEGATIVE
Leukocytes, UA: NEGATIVE
Nitrite, UA: NEGATIVE
Odor: NEGATIVE
Protein, UA: NEGATIVE
Spec Grav, UA: 1.015 (ref 1.010–1.025)
Urobilinogen, UA: 0.2 E.U./dL
pH, UA: 6.5 (ref 5.0–8.0)

## 2019-03-15 NOTE — Progress Notes (Addendum)
Subjective:    Patient ID: Joel Coffey, male    DOB: 1960/06/21, 58 y.o.   MRN: [id number]  HPI  58 year old Male for health maintenance exam and evaluation of medical issues.  No new complaints except some nasal stuffiness. Takes prn decongestant. May try Flonase nasal spray.  He has been on lipid-lowering medication for pure hypercholesterolemia since 2010.  In October 2019 he had an episode of transient global amnesia.  He was evaluated by Dr. Jannifer Franklin and had a negative MRI of the brain.  Has had no further issues.  This occurred after participating in a heavy exercise class.  He has a history of migraine headaches, mitral regurgitation, allergic rhinitis.  Seldom has headaches.  At one point there was a question of Augmentin allergy but he subsequently went to an allergist and had formal testing for Penicillin allergy and was found not to have Penicillin allergy.  History of scrotal pain 2004 and was treated for presumed epididymitis.  Past medical history: Vasectomy 2002.  Allergy testing done in 2009 showed very strong reactions to multiple grasses and tree pollens.  He had mild reactivity to selected molds, dust mite, cat dander and cockroach  Remote history of possible irritable bowel syndrome treated with pneumonia but currently this is not an issue.  History of cardiac murmur dating back to the 1990s.  Has been told he does not need SBE prophylaxis.  Dr. Verl Blalock saw him in 2007, ordered a 2D echocardiogram showing trivial mitral valvular regurgitation.  No evidence of mitral stenosis or mitral valve prolapse.  Overall left ventricular systolic function was normal.  Social history: He is married.  2 adult children, a son and a daughter.  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  Patient is a non-smoker.  Social alcohol consumption.  Family history: Father died at age 21 of lymphoma.  Mother died May 17, 2016 at age 33 with complications of dementia.   One brother died with ruptured esophagus.  3 brothers living in good health.  No sisters.    Review of Systems  Constitutional: Negative.   All other systems reviewed and are negative.      Objective:   Physical Exam Vitals signs reviewed.  Constitutional:      General: He is not in acute distress.    Appearance: Normal appearance. He is normal weight.  HENT:     Head: Normocephalic and atraumatic.     Right Ear: Tympanic membrane and ear canal normal.     Left Ear: Tympanic membrane and ear canal normal.     Nose: Nose normal.     Mouth/Throat:     Mouth: Mucous membranes are moist.     Pharynx: Oropharynx is clear.  Eyes:     General: No scleral icterus.    Extraocular Movements: Extraocular movements intact.     Conjunctiva/sclera: Conjunctivae normal.     Pupils: Pupils are equal, round, and reactive to light.  Neck:     Musculoskeletal: Neck supple.     Vascular: No carotid bruit.  Cardiovascular:     Rate and Rhythm: Normal rate and regular rhythm.     Pulses: Normal pulses.     Heart sounds: Normal heart sounds.     Comments: 1/6 systolic ejection murmur heard along left sternal border Pulmonary:     Effort: Pulmonary effort is normal.     Breath sounds: Normal breath sounds. No wheezing or rales.  Abdominal:  General: Bowel sounds are normal. There is no distension.     Palpations: Abdomen is soft. There is no mass.     Tenderness: There is no guarding or rebound.  Genitourinary:    Prostate: Normal.  Musculoskeletal:     Right lower leg: No edema.     Left lower leg: No edema.  Lymphadenopathy:     Cervical: No cervical adenopathy.  Skin:    General: Skin is warm and dry.  Neurological:     General: No focal deficit present.     Mental Status: He is alert and oriented to person, place, and time.     Cranial Nerves: No cranial nerve deficit.     Sensory: No sensory deficit.     Coordination: Coordination normal.     Gait: Gait normal.     Deep  Tendon Reflexes: Reflexes normal.  Psychiatric:        Mood and Affect: Mood normal.        Behavior: Behavior normal.        Thought Content: Thought content normal.        Judgment: Judgment normal.    Blood pressure 140/90 pulse 80 weight 194 pounds BMI 26.31 he is afebrile.  Pulse oximetry 98%.       Assessment & Plan:  Hyperlipidemia-stable on statin therapy  History of anxiety  treated with Lexapro and Tranxene  Allergic rhinitis treated with Flonase and Claritin  History of transient global amnesia x1  History of trivial mitral valvular regurgitation  Essential hypertension treated with amlodipine  Plan: Continue current medications.  Return in 1 year or as needed.  He will get flu vaccine at [facility].

## 2019-03-22 NOTE — Patient Instructions (Signed)
It was a pleasure to see you today.  Please have flu vaccine at local pharmacy.  Return in 1 year or as needed.  Continue current medications.

## 2019-05-02 ENCOUNTER — Other Ambulatory Visit: Payer: Self-pay | Admitting: Internal Medicine

## 2019-06-07 DIAGNOSIS — U071 COVID-19: Secondary | ICD-10-CM | POA: Diagnosis not present

## 2019-06-08 ENCOUNTER — Ambulatory Visit: Payer: BLUE CROSS/BLUE SHIELD | Attending: Internal Medicine

## 2019-06-08 DIAGNOSIS — Z20822 Contact with and (suspected) exposure to covid-19: Secondary | ICD-10-CM

## 2019-06-09 ENCOUNTER — Telehealth: Payer: Self-pay | Admitting: Internal Medicine

## 2019-06-09 LAB — NOVEL CORONAVIRUS, NAA: SARS-CoV-2, NAA: NOT DETECTED

## 2019-06-09 NOTE — Telephone Encounter (Signed)
Joel Coffey [phone]  [name] called to [place] [facility] [place] [facility] [place] [facility] [place] and he got results back this morning and they were positive, however he went to [facility] yesterday and those results came back negative. He was exposed on 06/02/2019. He has been under quarantine since he found out he was exposed. No symptoms

## 2019-07-24 IMAGING — CT CT ANGIO NECK
1 of 12 series · 5 of 33 positions shown · IV contrast (iopamidol)
Comparison: None.

CLINICAL DATA: Focal neuro deficit less than 6 hours. Confusion
lightheaded.

EXAM:
CT ANGIOGRAPHY HEAD AND NECK
TECHNIQUE: Multidetector CT imaging of the head and neck was performed using
the standard protocol during bolus administration of intravenous
contrast. Multiplanar CT image reconstructions and MIPs were
obtained to evaluate the vascular anatomy. Carotid stenosis
measurements (when applicable) are obtained utilizing NASCET
criteria, using the distal internal carotid diameter as the
denominator.
CONTRAST:  50mL M628K6-OCD IOPAMIDOL (M628K6-OCD) INJECTION 76%

[Series 12: cta neck axial · axial · 0.39mm/px · z∈[-246,+6]mm · 5 of 378 slices shown]
[im 63/378  soft-tissue]
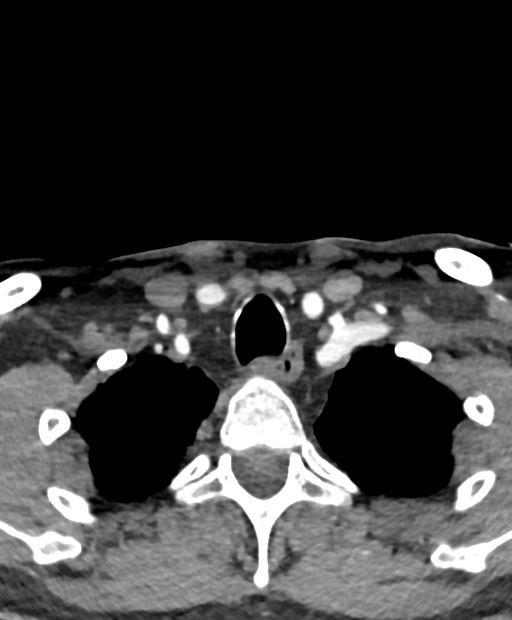
[im 126/378  bone]
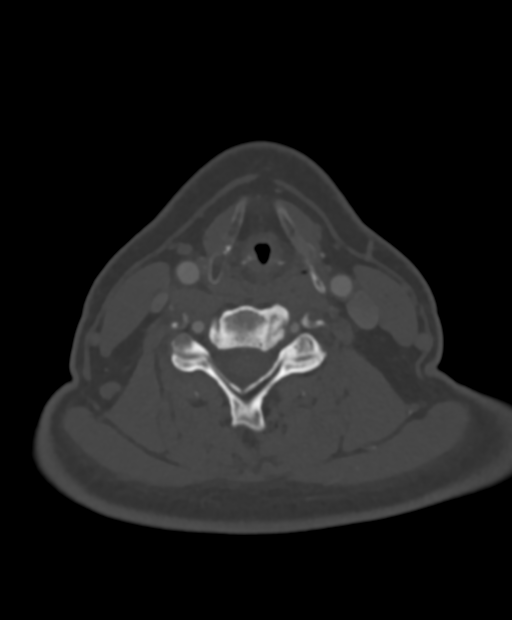
[im 189/378  soft-tissue]
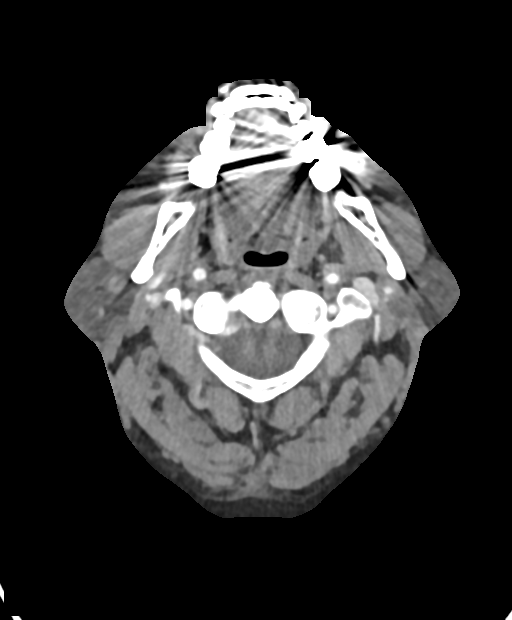
[im 252/378  bone]
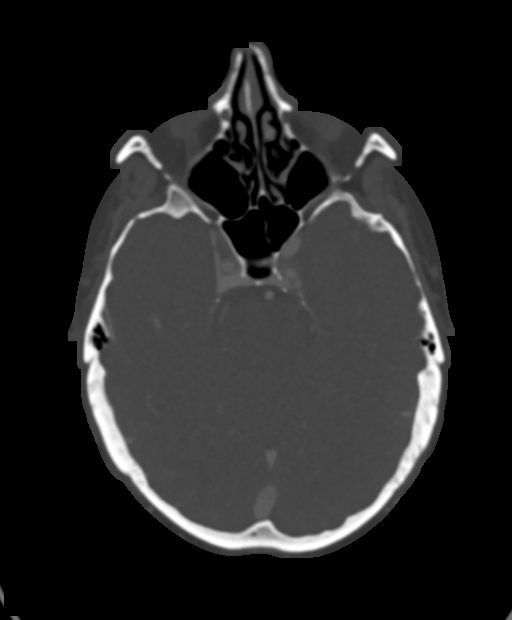
[im 315/378  soft-tissue]
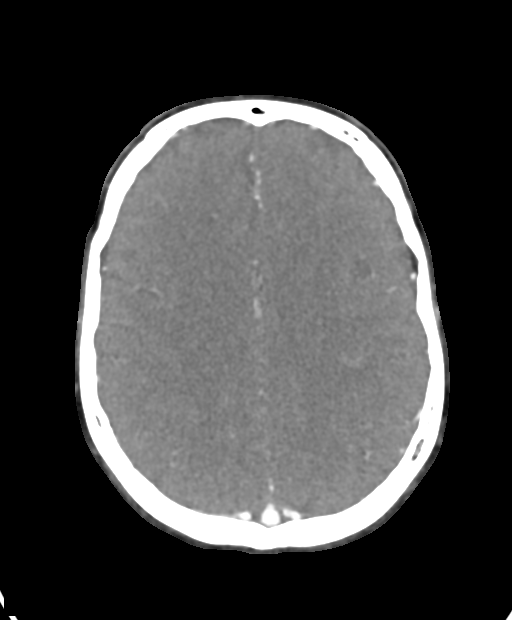

[5 of 33 positions shown; findings below may reference images not displayed]

FINDINGS: CT HEAD FINDINGS

Brain: No evidence of acute infarction, hemorrhage, hydrocephalus,
extra-axial collection or mass lesion/mass effect.

Vascular: Negative for hyperdense vessel

Skull: Negative

Sinuses: Mild mucosal edema paranasal sinuses.

Orbits: Normal orbit

Review of the MIP images confirms the above findings

CTA NECK FINDINGS

Aortic arch: Normal

Right carotid system: Normal right carotid system without stenosis

Left carotid system: No significant left carotid stenosis. Mild
atherosclerotic plaque in the left carotid bulb.

Vertebral arteries: Normal vertebral arteries bilaterally. Both
vertebral arteries patent to the basilar.

Skeleton: Disc degeneration and spondylosis. No acute skeletal
abnormality.

Other neck: Negative

Upper chest: Negative

Review of the MIP images confirms the above findings

CTA HEAD FINDINGS

Anterior circulation: Cavernous carotid widely patent without
stenosis or calcification. Anterior and middle cerebral arteries
widely patent

Posterior circulation: Both vertebral arteries patent to the
basilar. Right PICA patent. Left PICA. To be supplied from the left
AICA. Superior cerebellar and posterior cerebral arteries patent
bilaterally without stenosis.

Venous sinuses: Patent

Anatomic variants: None

Delayed phase: Normal enhancement on delayed imaging.

Review of the MIP images confirms the above findings
IMPRESSION: No significant intracranial or extracranial stenosis. Negative for
emergent large vessel occlusion. Early atherosclerotic disease left
carotid bulb.

## 2019-08-05 ENCOUNTER — Ambulatory Visit (HOSPITAL_COMMUNITY)
Admission: EM | Admit: 2019-08-05 | Discharge: 2019-08-05 | Disposition: A | Discharge status: Home / Self Care | Payer: BC Managed Care – PPO | Attending: Family Medicine | Admitting: Family Medicine

## 2019-08-05 ENCOUNTER — Other Ambulatory Visit: Payer: Self-pay

## 2019-08-05 ENCOUNTER — Encounter (HOSPITAL_COMMUNITY): Payer: Self-pay

## 2019-08-05 DIAGNOSIS — J0100 Acute maxillary sinusitis, unspecified: Secondary | ICD-10-CM | POA: Insufficient documentation

## 2019-08-05 DIAGNOSIS — K219 Gastro-esophageal reflux disease without esophagitis: Secondary | ICD-10-CM | POA: Insufficient documentation

## 2019-08-05 DIAGNOSIS — I10 Essential (primary) hypertension: Secondary | ICD-10-CM | POA: Diagnosis not present

## 2019-08-05 DIAGNOSIS — Z20822 Contact with and (suspected) exposure to covid-19: Secondary | ICD-10-CM | POA: Diagnosis not present

## 2019-08-05 DIAGNOSIS — E785 Hyperlipidemia, unspecified: Secondary | ICD-10-CM | POA: Insufficient documentation

## 2019-08-05 DIAGNOSIS — Z880 Allergy status to penicillin: Secondary | ICD-10-CM | POA: Insufficient documentation

## 2019-08-05 DIAGNOSIS — Z79899 Other long term (current) drug therapy: Secondary | ICD-10-CM | POA: Diagnosis not present

## 2019-08-05 MED ORDER — DOXYCYCLINE HYCLATE 100 MG PO CAPS: 100.0000 mg | ORAL_CAPSULE | Freq: Two times a day (BID) | ORAL | 0 refills | Status: DC

## 2019-08-05 NOTE — ED Triage Notes (Signed)
Pt states he has a sinus pressure and congestion  X 1 month

## 2019-08-05 NOTE — ED Provider Notes (Signed)
[name]    CSN: [id number] Arrival date & time: 08/05/19  0846      History   Chief Complaint Chief Complaint  Patient presents with  . Cough  . Nasal Congestion    HPI Joel Coffey is a 59 y.o. male.   HPI  Patient is here for sinus problems.  He states that he has environmental allergies.  Gets sinus drainage at least once a year.  Usually takes Claritin and will clear up on its own.  This time, however, he has sinus congestion, pressure, purulent drainage that has been lasting for about a month.  Sometimes sore throat.  No coughing or chest congestion although the mucus collects in the back of his throat and needs to be cleared.  No headache but he does have pressure and pain in his face.  It is not improving with his conservative measures of Claritin, nasal saline, fluids. Had a reaction to Augmentin.  Broke out in a rash all over.  Was told he was "possibly" allergic to penicillins. As well hypertension, hyperlipidemia, GERD  Past Medical History:  Diagnosis Date  . Hyperlipidemia   . Migraine headache   . Mitral regurgitation   . Vision abnormalities   . Vitamin D deficiency     Patient Active Problem List   Diagnosis Date Noted  . Hyperlipidemia 03/04/2011  . Allergic rhinitis 03/04/2011  . Vitamin D deficiency 03/04/2011  . Headache, classical migraine 03/04/2011  . Mitral regurgitation 03/04/2011    Past Surgical History:  Procedure Laterality Date  . HERNIA REPAIR    . VASECTOMY         Home Medications    Prior to Admission medications   Medication Sig Start Date End Date Taking? Authorizing Provider  amLODipine (NORVASC) 5 MG tablet Take 1 tablet (5 mg total) by mouth daily. 09/18/18   Elby Showers, MD  clorazepate (TRANXENE-T) 3.75 MG tablet Take 1 tablet (3.75 mg total) by mouth 2 (two) times daily as needed for anxiety. 07/03/18   Elby Showers, MD  doxycycline (VIBRAMYCIN) 100 MG capsule Take 1 capsule (100 mg total) by mouth 2  (two) times daily. 08/05/19   Raylene Everts, MD  escitalopram (LEXAPRO) 10 MG tablet Take 1 tablet (10 mg total) by mouth daily. 10/29/18   Elby Showers, MD  fluticasone (FLONASE) 50 MCG/ACT nasal spray Place 2 sprays into both nostrils daily. 01/31/18   Dutch Quint B, FNP  ibuprofen (ADVIL) 800 MG tablet TAKE 1 TABLET(800 MG) BY MOUTH EVERY 8 HOURS AS NEEDED FOR PAIN 12/03/18   Elby Showers, MD  loratadine (CLARITIN) 10 MG tablet Take 10 mg by mouth daily.    [provider]  Multiple Vitamins-Minerals (MULTIVITAMIN,TX-MINERALS) tablet Take 1 tablet by mouth daily.      [provider]  pantoprazole (PROTONIX) 40 MG tablet Take 1 tablet (40 mg total) by mouth daily. 03/04/19   Elby Showers, MD  simvastatin (ZOCOR) 10 MG tablet TAKE 1 TABLET(10 MG) BY MOUTH DAILY 05/02/19   Elby Showers, MD    Family History Family History  Problem Relation Age of Onset  . Cancer Father     Social History Social History   Tobacco Use  . Smoking status: Never Smoker  . Smokeless tobacco: Never Used  Substance Use Topics  . Alcohol use: Yes  . Drug use: No     Allergies   Amoxicillin-pot clavulanate   Review of Systems Review of Systems  Constitutional: Negative for chills and fever.  HENT: Positive for congestion, postnasal drip, rhinorrhea, sinus pressure and sinus pain. Negative for trouble swallowing.   Respiratory: Negative for cough and shortness of breath.   Musculoskeletal: Negative for neck pain.  Neurological: Positive for headaches. Negative for light-headedness.     Physical Exam Triage Vital Signs ED Triage Vitals  Enc Vitals Group     BP 08/05/19 0940 (!) 147/70     Pulse Rate 08/05/19 0940 75     Resp 08/05/19 0940 16     Temp 08/05/19 0940 99 F (37.2 C)     Temp Source 08/05/19 0940 Oral     SpO2 08/05/19 0940 100 %     Weight 08/05/19 0939 208 lb (94.3 kg)     Height --      Head Circumference --      Peak Flow --      Pain Score  08/05/19 0939 5     Pain Loc --      Pain Edu? --      Excl. in [place]? --    No data found.  Updated Vital Signs BP (!) 147/70 (BP Location: Right Arm)   Pulse 75   Temp 99 F (37.2 C) (Oral)   Resp 16   Wt 94.3 kg   SpO2 100%   BMI 28.21 kg/m      Physical Exam Constitutional:      General: He is not in acute distress.    Appearance: He is well-developed. He is not ill-appearing.  HENT:     Head: Normocephalic and atraumatic.     Nose: Congestion present.     Mouth/Throat:     Pharynx: Posterior oropharyngeal erythema present.     Comments: Mild erythema posterior pharynx.  Tenderness to palpation of maxillary sinuses.  Mild nasal congestion noted. Eyes:     Conjunctiva/sclera: Conjunctivae normal.     Pupils: Pupils are equal, round, and reactive to light.  Cardiovascular:     Rate and Rhythm: Normal rate and regular rhythm.     Heart sounds: Normal heart sounds.  Pulmonary:     Effort: Pulmonary effort is normal. No respiratory distress.     Breath sounds: Normal breath sounds.  Musculoskeletal:        General: Normal range of motion.     Cervical back: Normal range of motion.  Lymphadenopathy:     Cervical: No cervical adenopathy.  Skin:    General: Skin is warm and dry.  Neurological:     Mental Status: He is alert.  Psychiatric:        Mood and Affect: Mood normal.        Behavior: Behavior normal.      UC Treatments / Results  Labs (all labs ordered are listed, but only abnormal results are displayed) Labs Reviewed  SARS CORONAVIRUS 2 (TAT 6-24 HRS)    EKG   Radiology No results found.  Procedures Procedures (including critical care time)  Medications Ordered in UC Medications - No data to display  Initial Impression / Assessment and Plan / UC Course  I have reviewed the triage vital signs and the nursing notes.  Pertinent labs & imaging results that were available during my care of the patient were reviewed by me and considered in my  medical decision making (see chart for details).     We discussed that the CDC has found most sinus infections are caused by viruses.  Patient usually manages a sinus  infections at home.  The sinus infection, however, is lasting for a month and is having worsening symptoms and purulent nasal discharge.  At this time I believe he needs an antibiotic to help clear out.  With his penicillin allergy is appropriate to try doxycycline.  He will follow up with his primary care physician Final Clinical Impressions(s) / UC Diagnoses   Final diagnoses:  Acute non-recurrent maxillary sinusitis     Discharge Instructions     Drink plenty of fluids Continue Claritin and Flonase 2 times a day until symptoms improve Take doxycycline 2 times a day until finished Take 2 doses today See your PCP if symptoms fail to improve Call for questions or problems   ED Prescriptions    Medication Sig Dispense Auth. Provider   doxycycline (VIBRAMYCIN) 100 MG capsule Take 1 capsule (100 mg total) by mouth 2 (two) times daily. 20 capsule Raylene Everts, MD     PDMP not reviewed this encounter.   Raylene Everts, MD 08/05/19 1101

## 2019-08-05 NOTE — Discharge Instructions (Signed)
Drink plenty of fluids Continue Claritin and Flonase 2 times a day until symptoms improve Take doxycycline 2 times a day until finished Take 2 doses today See your PCP if symptoms fail to improve Call for questions or problems

## 2019-08-06 LAB — SARS CORONAVIRUS 2 (TAT 6-24 HRS): SARS Coronavirus 2: NEGATIVE

## 2019-09-03 ENCOUNTER — Telehealth: Payer: Self-pay | Admitting: Internal Medicine

## 2019-09-03 ENCOUNTER — Telehealth (INDEPENDENT_AMBULATORY_CARE_PROVIDER_SITE_OTHER): Payer: BC Managed Care – PPO | Admitting: Internal Medicine

## 2019-09-03 ENCOUNTER — Encounter: Payer: Self-pay | Admitting: Internal Medicine

## 2019-09-03 ENCOUNTER — Other Ambulatory Visit: Payer: Self-pay

## 2019-09-03 VITALS — Ht 72.0 in

## 2019-09-03 DIAGNOSIS — J0100 Acute maxillary sinusitis, unspecified: Secondary | ICD-10-CM | POA: Diagnosis not present

## 2019-09-03 DIAGNOSIS — J309 Allergic rhinitis, unspecified: Secondary | ICD-10-CM | POA: Diagnosis not present

## 2019-09-03 MED ORDER — AZITHROMYCIN 250 MG PO TABS: ORAL_TABLET | ORAL | 0 refills | Status: DC

## 2019-09-03 NOTE — Telephone Encounter (Signed)
Is he OK with virtual visit at 4:45?

## 2019-09-03 NOTE — Progress Notes (Signed)
   Subjective:    Patient ID: Joel Coffey, male    DOB: 1960-07-26, 59 y.o.   MRN: [id number]  HPI 59 year old Male seen today by interactive audio and video telecommunications due to the coronavirus pandemic.  He is identified using 2 identifiers as Joel Coffey, a patient in this practice.  He is agreeable to visit in this format today.    History of allergic rhinitis and symptoms have been  treated with antihistamines and decongestants as needed.  Flonase has also been recommended.  He is having issues with nasal congestion, postnasal drip and sinus pressure.  No fever or chills.  No sore throat.  No known COVID-19 exposure.    Review of Systems see above     Objective:   Physical Exam  Reports being afebrile.  Seen today in no acute distress but clearly articulate symptoms consistent with sinusitis and allergic rhinitis      Assessment & Plan:  Acute maxillary sinusitis  Allergic rhinitis  Plan: Zithromax Z-PAK take 2 tablets day 1 followed by 1 tablet days 2 through 5.  Continue nasal spray and can require 10.  Call if symptoms not improving in 7 to 10 days or sooner if worse.

## 2019-09-03 NOTE — Telephone Encounter (Signed)
Joel Coffey [phone]  [name] called to say that he has head congestion with drainage and a cough. No fever, No COVID exposure, received 2nd COVID vaccine this past Monday. Would like to be seen, but not expecting to be seen today

## 2019-09-03 NOTE — Telephone Encounter (Signed)
Scheduled virtual visit °

## 2019-09-09 ENCOUNTER — Encounter: Payer: Self-pay | Admitting: Internal Medicine

## 2019-09-09 NOTE — Patient Instructions (Signed)
It was a pleasure to see you today virtually.  Take Zithromax Z-PAK 2 tablets p.o. day 1 followed by 1 tablet p.o. days 2 through 5.  Continue decongestant and antihistamine as well as Flonase nasal spray.  Call if not better in 7 to 10 days or sooner if worse.

## 2019-09-15 ENCOUNTER — Other Ambulatory Visit: Payer: Self-pay

## 2019-09-15 MED ORDER — IBUPROFEN 800 MG PO TABS: ORAL_TABLET | ORAL | 1 refills | Status: DC

## 2019-09-15 NOTE — Telephone Encounter (Signed)
Last refill 12/03/18, had CPE on 03/15/19

## 2019-09-17 ENCOUNTER — Telehealth: Payer: Self-pay

## 2019-09-17 MED ORDER — AMLODIPINE BESYLATE 5 MG PO TABS: 5.0000 mg | ORAL_TABLET | Freq: Every day | ORAL | 1 refills | Status: DC

## 2019-09-17 NOTE — Telephone Encounter (Signed)
Refill OK

## 2019-09-17 NOTE — Telephone Encounter (Signed)
Received Fax RX request from  Pharmacy - [facility] [address]    Medication -  Amlodipine Besylate 5 mg tabs    Last Refill -   06-19-19  Last OV -   09-03-19  Last CPE -   03-15-19  Next Appointment -   none

## 2019-09-20 ENCOUNTER — Other Ambulatory Visit: Payer: Self-pay

## 2019-09-20 MED ORDER — PANTOPRAZOLE SODIUM 40 MG PO TBEC: 40.0000 mg | DELAYED_RELEASE_TABLET | Freq: Every day | ORAL | 1 refills | Status: DC

## 2019-09-20 NOTE — Telephone Encounter (Signed)
Had CPE on 03/15/19

## 2019-10-12 ENCOUNTER — Encounter: Payer: Self-pay | Admitting: Internal Medicine

## 2019-10-21 ENCOUNTER — Other Ambulatory Visit: Payer: Self-pay

## 2019-10-21 ENCOUNTER — Encounter: Payer: Self-pay | Admitting: Internal Medicine

## 2019-10-21 ENCOUNTER — Ambulatory Visit: Payer: BC Managed Care – PPO | Admitting: Internal Medicine

## 2019-10-21 VITALS — BP 130/80 | HR 78 | Temp 98.0°F | Ht 72.0 in | Wt 194.0 lb

## 2019-10-21 DIAGNOSIS — J0100 Acute maxillary sinusitis, unspecified: Secondary | ICD-10-CM | POA: Diagnosis not present

## 2019-10-21 DIAGNOSIS — J309 Allergic rhinitis, unspecified: Secondary | ICD-10-CM | POA: Diagnosis not present

## 2019-10-21 NOTE — Patient Instructions (Addendum)
Epic electronic medical record is down temporarily,  so faxed Rxs to pharmacy. Prednisone 10 mg tablets (#21) 6-5-4-3-2-1 taper and Doxycycline 100 mg twice daily x 10 days.Call in not improving 10 days.

## 2019-10-21 NOTE — Progress Notes (Signed)
   Subjective:    Patient ID: Joel Coffey, male    DOB: 1960-12-03, 59 y.o.   MRN: [id number]  HPI 59 year old Male seen in person for persistent nasal congestion.   Patient was seen virtually on April 9.  He has a history of allergic rhinitis and occasional bouts of maxillary sinusitis.  At that time he was having issues with nasal congestion, postnasal drip and sinus pressure without fever or sore throat.  He was treated with Zithromax Z-Pak and given diagnosis of acute maxillary sinusitis and allergic rhinitis.  Apparently he had minimal improvement.    Now seen today in person with persistent nasal congestion and sinus pressure.  No fever or chills.  No sore throat or dysgeusia.    Review of Systems see above     Objective:   Physical Exam  BP 130/80 pulse 78, T 98.0  degrees Pulse ox 97% Weight 194 pounds. Height 6 ft. 0 in  Skin warm and dry.  No cervical adenopathy.  TMs are clear.  Pharynx is clear.  Neck is supple.  Boggy nasal mucosa bilaterally.  Chest is clear to auscultation without rales or wheezing.      Assessment & Plan:  Protracted allergy and sinusitis symptoms since early May  Plan: Doxycycline 100 mg twice daily for 10 days.  Prednisone 10 mg (#21) going from 60 mg to 0 mg over 7 days.  If not improving in 10 days, will need to see allergist.

## 2019-11-08 ENCOUNTER — Telehealth: Payer: Self-pay | Admitting: Internal Medicine

## 2019-11-08 NOTE — Telephone Encounter (Signed)
CPE due October please book before refilling

## 2019-11-08 NOTE — Telephone Encounter (Signed)
Received Fax RX request from  [facility] [place], [place] - [address] Phone:  [phone]  Fax:  [phone]       Medication - escitalopram (LEXAPRO) 10 MG tablet   Last Refill - 11/09/19  Last OV - 10/21/19  Last CPE - 03/15/19  Next Appointment -

## 2019-11-08 NOTE — Telephone Encounter (Signed)
Left message to call back  

## 2019-11-10 MED ORDER — ESCITALOPRAM OXALATE 10 MG PO TABS: 10.0000 mg | ORAL_TABLET | Freq: Every day | ORAL | 5 refills | Status: DC

## 2019-12-03 ENCOUNTER — Encounter: Payer: Self-pay | Admitting: Internal Medicine

## 2019-12-06 ENCOUNTER — Other Ambulatory Visit: Payer: Self-pay

## 2019-12-06 MED ORDER — IBUPROFEN 800 MG PO TABS: ORAL_TABLET | ORAL | 3 refills | Status: DC

## 2020-01-03 ENCOUNTER — Encounter: Payer: BC Managed Care – PPO | Admitting: Internal Medicine

## 2020-01-19 ENCOUNTER — Ambulatory Visit (AMBULATORY_SURGERY_CENTER): Payer: Self-pay | Admitting: *Deleted

## 2020-01-19 ENCOUNTER — Other Ambulatory Visit: Payer: Self-pay

## 2020-01-19 VITALS — Ht 72.0 in | Wt 214.0 lb

## 2020-01-19 DIAGNOSIS — Z1211 Encounter for screening for malignant neoplasm of colon: Secondary | ICD-10-CM

## 2020-01-19 NOTE — Progress Notes (Signed)
Patient is here in-person for PV. Patient denies any allergies to eggs or soy. Patient denies any problems with anesthesia/sedation. Patient denies any oxygen use at home. Patient denies taking any diet/weight loss medications or blood thinners. Patient is not being treated for MRSA or C-diff. Patient is aware of our care-partner policy and TJQZE-09 safety protocol. EMMI education  Sent to [employer]. COVID-19 vaccines completed per pt.

## 2020-01-20 ENCOUNTER — Encounter: Payer: Self-pay | Admitting: Internal Medicine

## 2020-02-02 ENCOUNTER — Other Ambulatory Visit: Payer: Self-pay

## 2020-02-02 ENCOUNTER — Encounter: Payer: Self-pay | Admitting: Internal Medicine

## 2020-02-02 ENCOUNTER — Encounter: Payer: BC Managed Care – PPO | Admitting: Internal Medicine

## 2020-02-02 ENCOUNTER — Ambulatory Visit (AMBULATORY_SURGERY_CENTER): Payer: BC Managed Care – PPO | Admitting: Internal Medicine

## 2020-02-02 VITALS — BP 131/76 | HR 60 | Temp 97.1°F | Resp 18 | Ht 72.0 in | Wt 214.0 lb

## 2020-02-02 DIAGNOSIS — Z1211 Encounter for screening for malignant neoplasm of colon: Secondary | ICD-10-CM | POA: Diagnosis not present

## 2020-02-02 DIAGNOSIS — D123 Benign neoplasm of transverse colon: Secondary | ICD-10-CM | POA: Diagnosis not present

## 2020-02-02 DIAGNOSIS — D122 Benign neoplasm of ascending colon: Secondary | ICD-10-CM | POA: Diagnosis not present

## 2020-02-02 DIAGNOSIS — D124 Benign neoplasm of descending colon: Secondary | ICD-10-CM | POA: Diagnosis not present

## 2020-02-02 MED ORDER — SODIUM CHLORIDE 0.9 % IV SOLN: 500.0000 mL | Freq: Once | INTRAVENOUS | Status: DC

## 2020-02-02 NOTE — Progress Notes (Signed)
[facility]  Pt's states no medical or surgical changes since previsit or office visit.  

## 2020-02-02 NOTE — Op Note (Signed)
[facility] Patient Name: Joel Coffey Procedure Date: 02/02/2020 3:47 PM MRN: [id number] Endoscopist: Docia Chuck. Henrene Pastor , MD Age: 59 Referring MD:  Date of Birth: 1960-07-13 Gender: Male Account #: [id number] Procedure:                Colonoscopy with cold snare polypectomy x 3 Indications:              Screening for colorectal malignant neoplasm.                            Negative index exam 2011 (Dr. Carlean Purl) Medicines:                Monitored Anesthesia Care Procedure:                Pre-Anesthesia Assessment:                           - Prior to the procedure, a History and Physical                            was performed, and patient medications and                            allergies were reviewed. The patient's tolerance of                            previous anesthesia was also reviewed. The risks                            and benefits of the procedure and the sedation                            options and risks were discussed with the patient.                            All questions were answered, and informed consent                            was obtained. Prior Anticoagulants: The patient has                            taken no previous anticoagulant or antiplatelet                            agents. ASA Grade Assessment: II - A patient with                            mild systemic disease. After reviewing the risks                            and benefits, the patient was deemed in                            satisfactory condition to undergo the procedure.  After obtaining informed consent, the colonoscope                            was passed under direct vision. Throughout the                            procedure, the patient's blood pressure, pulse, and                            oxygen saturations were monitored continuously. The                            Colonoscope was introduced through the anus and                             advanced to the the cecum, identified by                            appendiceal orifice and ileocecal valve. The                            ileocecal valve, appendiceal orifice, and rectum                            were photographed. The quality of the bowel                            preparation was excellent. The colonoscopy was                            performed without difficulty. The patient tolerated                            the procedure well. The bowel preparation used was                            Miralax via split dose instruction. Scope In: 4:00:20 PM Scope Out: 4:16:01 PM Scope Withdrawal Time: 0 hours 13 minutes 20 seconds  Total Procedure Duration: 0 hours 15 minutes 41 seconds  Findings:                 Three polyps were found in the descending colon,                            transverse colon and ascending colon. The polyps                            were 2 to 4 mm in size. These polyps were removed                            with a cold snare. Resection and retrieval were                            complete.  Multiple medium-mouthed diverticula were found in                            the sigmoid colon.                           The exam was otherwise without abnormality on                            direct and retroflexion views. Complications:            No immediate complications. Estimated blood loss:                            None. Estimated Blood Loss:     Estimated blood loss: none. Impression:               - Three 2 to 4 mm polyps in the descending colon,                            in the transverse colon and in the ascending colon,                            removed with a cold snare. Resected and retrieved.                           - Diverticulosis in the sigmoid colon.                           - The examination was otherwise normal on direct                            and retroflexion views. Recommendation:           -  Repeat colonoscopy in 5-10 years for surveillance.                           - Patient has a contact number available for                            emergencies. The signs and symptoms of potential                            delayed complications were discussed with the                            patient. Return to normal activities tomorrow.                            Written discharge instructions were provided to the                            patient.                           - Resume previous diet.                           -  Continue present medications.                           - Await pathology results. Docia Chuck. Henrene Pastor, MD 02/02/2020 4:22:37 PM This report has been signed electronically.

## 2020-02-02 NOTE — Progress Notes (Signed)
Called to room to assist during endoscopic procedure.  Patient ID and intended procedure confirmed with present staff. Received instructions for my participation in the procedure from the performing physician.  

## 2020-02-02 NOTE — Patient Instructions (Signed)
Please, read all of the handouts given to you by your recovery room nurse.  Thank-you for choosing [place] for your healthcare needs today.  YOU HAD AN ENDOSCOPIC PROCEDURE TODAY AT [facility]:   Refer to the procedure report that was given to you for any specific questions about what was found during the examination.  If the procedure report does not answer your questions, please call your gastroenterologist to clarify.  If you requested that your care partner not be given the details of your procedure findings, then the procedure report has been included in a sealed envelope for you to review at your convenience later.  YOU SHOULD EXPECT: Some feelings of bloating in the abdomen. Passage of more gas than usual.  Walking can help get rid of the air that was put into your GI tract during the procedure and reduce the bloating. If you had a lower endoscopy (such as a colonoscopy or flexible sigmoidoscopy) you may notice spotting of blood in your stool or on the toilet paper. If you underwent a bowel prep for your procedure, you may not have a normal bowel movement for a few days.  Please Note:  You might notice some irritation and congestion in your nose or some drainage.  This is from the oxygen used during your procedure.  There is no need for concern and it should clear up in a day or so.  SYMPTOMS TO REPORT IMMEDIATELY:   Following lower endoscopy (colonoscopy or flexible sigmoidoscopy):  Excessive amounts of blood in the stool  Significant tenderness or worsening of abdominal pains  Swelling of the abdomen that is new, acute  Fever of 100F or higher   For urgent or emergent issues, a gastroenterologist can be reached at any hour by calling [phone]. Do not use MyChart messaging for urgent concerns.    DIET:  We do recommend a small meal at first, but then you may proceed to your regular diet.  Drink plenty of fluids but you should avoid alcoholic beverages for 24  hours. Try to increase the fiber in your diet, and drink plenty of water.  ACTIVITY:  You should plan to take it easy for the rest of today and you should NOT DRIVE or use heavy machinery until tomorrow (because of the sedation medicines used during the test).    FOLLOW UP: Our staff will call the number listed on your records 48-72 hours following your procedure to check on you and address any questions or concerns that you may have regarding the information given to you following your procedure. If we do not reach you, we will leave a message.  We will attempt to reach you two times.  During this call, we will ask if you have developed any symptoms of COVID 19. If you develop any symptoms (ie: fever, flu-like symptoms, shortness of breath, cough etc.) before then, please call [phone].  If you test positive for Covid 19 in the 2 weeks post procedure, please call and report this information to [place].    If any biopsies were taken you will be contacted by phone or by letter within the next 1-3 weeks.  Please call us at [phone] if you have not heard about the biopsies in 3 weeks.    SIGNATURES/CONFIDENTIALITY: You and/or your care partner have signed paperwork which will be entered into your electronic medical record.  These signatures attest to the fact that that the information above on your After Visit Summary has been  reviewed and is understood.  Full responsibility of the confidentiality of this discharge information lies with you and/or your care-partner. 

## 2020-02-02 NOTE — Progress Notes (Signed)
A/ox3, pleased with MAC, report to RN 

## 2020-02-04 ENCOUNTER — Telehealth: Payer: Self-pay

## 2020-02-04 NOTE — Telephone Encounter (Signed)
  Follow up Call-  Call back number 02/02/2020  Post procedure Call Back phone  # [phone]  Permission to leave phone message Yes  Some recent data might be hidden     Patient questions:  Do you have a fever, pain , or abdominal swelling? No. Pain Score  0 *   Have you tolerated food without any problems? Yes.    Have you been able to return to your normal activities? Yes.    Do you have any questions about your discharge instructions: Diet   No. Medications  No. Follow up visit  No.  Do you have questions or concerns about your Care? No.  Actions: * If pain score is 4 or above: No action needed, pain <4.   1. Have you developed a fever since your procedure? No   2.   Have you had an respiratory symptoms (SOB or cough) since your procedure? No   3.   Have you tested positive for COVID 19 since your procedure? No   4.   Have you had any family members/close contacts diagnosed with the COVID 19 since your procedure?  No    If yes to any of these questions please route to Joylene John, RN and Joella Prince, RN

## 2020-02-08 ENCOUNTER — Encounter: Payer: Self-pay | Admitting: Internal Medicine

## 2020-03-03 ENCOUNTER — Other Ambulatory Visit: Payer: Self-pay | Admitting: Internal Medicine

## 2020-03-14 ENCOUNTER — Other Ambulatory Visit: Payer: Self-pay

## 2020-03-14 ENCOUNTER — Other Ambulatory Visit: Payer: BC Managed Care – PPO | Admitting: Internal Medicine

## 2020-03-14 DIAGNOSIS — G454 Transient global amnesia: Secondary | ICD-10-CM

## 2020-03-14 DIAGNOSIS — Z8659 Personal history of other mental and behavioral disorders: Secondary | ICD-10-CM

## 2020-03-14 DIAGNOSIS — Z125 Encounter for screening for malignant neoplasm of prostate: Secondary | ICD-10-CM | POA: Diagnosis not present

## 2020-03-14 DIAGNOSIS — E7800 Pure hypercholesterolemia, unspecified: Secondary | ICD-10-CM | POA: Diagnosis not present

## 2020-03-14 DIAGNOSIS — Z0000 Encounter for general adult medical examination without abnormal findings: Secondary | ICD-10-CM

## 2020-03-14 DIAGNOSIS — I10 Essential (primary) hypertension: Secondary | ICD-10-CM

## 2020-03-15 LAB — CBC WITH DIFFERENTIAL/PLATELET
Absolute Monocytes: 487 cells/uL (ref 200–950)
Basophils Absolute: 58 cells/uL (ref 0–200)
Basophils Relative: 1 %
Eosinophils Absolute: 168 cells/uL (ref 15–500)
Eosinophils Relative: 2.9 %
HCT: 47.2 % (ref 38.5–50.0)
Hemoglobin: 16.3 g/dL (ref 13.2–17.1)
Lymphs Abs: 1450 cells/uL (ref 850–3900)
MCH: 31.2 pg (ref 27.0–33.0)
MCHC: 34.5 g/dL (ref 32.0–36.0)
MCV: 90.2 fL (ref 80.0–100.0)
MPV: 9.7 fL (ref 7.5–12.5)
Monocytes Relative: 8.4 %
Neutro Abs: 3637 cells/uL (ref 1500–7800)
Neutrophils Relative %: 62.7 %
Platelets: 234 10*3/uL (ref 140–400)
RBC: 5.23 10*6/uL (ref 4.20–5.80)
RDW: 12.3 % (ref 11.0–15.0)
Total Lymphocyte: 25 %
WBC: 5.8 10*3/uL (ref 3.8–10.8)

## 2020-03-15 LAB — COMPLETE METABOLIC PANEL WITH GFR
AG Ratio: 1.8 (calc) (ref 1.0–2.5)
ALT: 44 U/L (ref 9–46)
AST: 36 U/L — ABNORMAL HIGH (ref 10–35)
Albumin: 4.4 g/dL (ref 3.6–5.1)
Alkaline phosphatase (APISO): 66 U/L (ref 35–144)
BUN: 8 mg/dL (ref 7–25)
CO2: 27 mmol/L (ref 20–32)
Calcium: 9.3 mg/dL (ref 8.6–10.3)
Chloride: 99 mmol/L (ref 98–110)
Creat: 0.71 mg/dL (ref 0.70–1.25)
GFR, Est African American: 117 mL/min/{1.73_m2} (ref >=60)
GFR, Est Non African American: 101 mL/min/{1.73_m2} (ref >=60)
Globulin: 2.4 g/dL (calc) (ref 1.9–3.7)
Glucose, Bld: 90 mg/dL (ref 65–99)
Potassium: 4.2 mmol/L (ref 3.5–5.3)
Sodium: 136 mmol/L (ref 135–146)
Total Bilirubin: 0.6 mg/dL (ref 0.2–1.2)
Total Protein: 6.8 g/dL (ref 6.1–8.1)

## 2020-03-15 LAB — PSA: PSA: 0.49 ng/mL (ref <=4.0)

## 2020-03-15 LAB — LIPID PANEL
Cholesterol: 208 mg/dL — ABNORMAL HIGH (ref <200)
HDL: 62 mg/dL (ref >=40)
LDL Cholesterol (Calc): 128 mg/dL (calc) — ABNORMAL HIGH
Non-HDL Cholesterol (Calc): 146 mg/dL (calc) — ABNORMAL HIGH (ref <130)
Total CHOL/HDL Ratio: 3.4 (calc) (ref <5.0)
Triglycerides: 83 mg/dL (ref <150)

## 2020-03-16 ENCOUNTER — Encounter: Payer: Self-pay | Admitting: Internal Medicine

## 2020-03-16 ENCOUNTER — Ambulatory Visit (INDEPENDENT_AMBULATORY_CARE_PROVIDER_SITE_OTHER): Payer: BC Managed Care – PPO | Admitting: Internal Medicine

## 2020-03-16 ENCOUNTER — Other Ambulatory Visit: Payer: Self-pay

## 2020-03-16 VITALS — BP 130/80 | HR 72 | Ht 72.0 in | Wt 214.0 lb

## 2020-03-16 DIAGNOSIS — Z79899 Other long term (current) drug therapy: Secondary | ICD-10-CM

## 2020-03-16 DIAGNOSIS — E7800 Pure hypercholesterolemia, unspecified: Secondary | ICD-10-CM

## 2020-03-16 DIAGNOSIS — I10 Essential (primary) hypertension: Secondary | ICD-10-CM | POA: Diagnosis not present

## 2020-03-16 DIAGNOSIS — Z8659 Personal history of other mental and behavioral disorders: Secondary | ICD-10-CM | POA: Diagnosis not present

## 2020-03-16 DIAGNOSIS — Z86010 Personal history of colonic polyps: Secondary | ICD-10-CM

## 2020-03-16 DIAGNOSIS — J309 Allergic rhinitis, unspecified: Secondary | ICD-10-CM

## 2020-03-16 DIAGNOSIS — Z0000 Encounter for general adult medical examination without abnormal findings: Secondary | ICD-10-CM

## 2020-03-16 LAB — POCT URINALYSIS DIPSTICK
Appearance: NEGATIVE
Bilirubin, UA: NEGATIVE
Blood, UA: NEGATIVE
Glucose, UA: NEGATIVE
Ketones, UA: NEGATIVE
Leukocytes, UA: NEGATIVE
Nitrite, UA: NEGATIVE
Odor: NEGATIVE
Protein, UA: NEGATIVE
Spec Grav, UA: 1.01 (ref 1.010–1.025)
Urobilinogen, UA: 0.2 E.U./dL
pH, UA: 6.5 (ref 5.0–8.0)

## 2020-03-16 NOTE — Progress Notes (Signed)
Subjective:    Patient ID: Joel Coffey, male    DOB: 1960-08-31, 59 y.o.   MRN: [id number]  HPI 59 year old Male for health maintenance exam and evaluation of medical issues.  History of pure hypercholesterolemia.  Recently has missed a few doses of statin medication and his LDL is elevated.  He will take  statin medication regularly.  He has been on lipid-lowering medication since 2010.  History of occasional bouts of sinusitis.  In October 2019 he had an episode of transient global amnesia.  He was evaluated by Dr. Jannifer Franklin and had negative MRI of the brain and has had no further issues.  This occurred after dissipating in a heavy exercise class.  He has a history of mitral regurgitation, migraine headaches, allergic rhinitis.  Seldom has headaches anymore.  At one point, there was question of Augmentin allergy but he subsequently went to an allergist and had formal testing for penicillin allergy.  He was found not to have penicillin allergy.  History of scrotal pain 2004 treated for presumed epididymitis.  Remote history of possible irritable bowel syndrome but currently this is not an issue.  History of cardiac murmur dating back to the 1990s.  He has been told he does not need SBE prophylaxis.  Dr. Verl Blalock saw him in 2007 order 2D echocardiogram showing trivial mitral valve regurgitation.  No evidence of mitral stenosis or mitral valve prolapse.  Overall left ventricular systolic function was normal.  Past medical history: Vasectomy 2002.  Allergy testing done in 2009 showed very strong reactions to multiple grasses and tree pollens.  He had mild reactivity to selected molds, dust mite, cat dander and cockroach.  Social history: He is married.  2 adult children, a son and a daughter.  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  Patient is a non-smoker.  Social alcohol consumption.  Family history: Father died at age 54 of lymphoma.  Mother died 05/13/16 at age  24 with complications of dementia.  1 brother died with ruptured esophagus.  3 brothers living in good health.  No sisters.    Review of Systems no new complaints-feels well.  Had recent colonoscopy by Dr. Henrene Pastor with 5-year follow-up recommended.  3 small adenomatous polyps were found.     Objective:   Physical Exam Vitals reviewed.  Constitutional:      General: He is not in acute distress.    Appearance: Normal appearance.  HENT:     Head: Normocephalic and atraumatic.     Right Ear: Tympanic membrane and ear canal normal.     Left Ear: Tympanic membrane and ear canal normal.     Nose: Nose normal.  Eyes:     General: No scleral icterus.       Right eye: No discharge.        Left eye: No discharge.     Extraocular Movements: Extraocular movements intact.     Conjunctiva/sclera: Conjunctivae normal.     Pupils: Pupils are equal, round, and reactive to light.  Neck:     Vascular: No carotid bruit.  Cardiovascular:     Rate and Rhythm: Normal rate and regular rhythm.     Pulses: Normal pulses.     Heart sounds: No murmur heard.   Pulmonary:     Effort: Pulmonary effort is normal. No respiratory distress.     Breath sounds: Normal breath sounds. No wheezing.  Abdominal:     General: Bowel sounds  are normal.     Palpations: Abdomen is soft. There is no mass.     Tenderness: There is no abdominal tenderness. There is no guarding or rebound.  Genitourinary:    Prostate: Normal.  Musculoskeletal:     Cervical back: Neck supple. No rigidity.     Right lower leg: No edema.     Left lower leg: No edema.  Skin:    General: Skin is warm.     Findings: No rash.  Neurological:     General: No focal deficit present.     Mental Status: He is alert and oriented to person, place, and time.     Cranial Nerves: No cranial nerve deficit.     Motor: No weakness.  Psychiatric:        Mood and Affect: Mood normal.        Behavior: Behavior normal.        Thought Content: Thought  content normal.        Judgment: Judgment normal.           Assessment & Plan:  Has gained  About 18 pounds since last year. BMI is 29.02 and was 26.31 last year. Watch diet. May need more exercise.  Elevated LDL but may have missed some doses. Take Lipitor daily. Hx of pure hypercholesterolemia.  Continue Zocor 10 mg daily.  History of allergic rhinitis treated with Claritin and Flonase nasal spray  Adenomatous colon polyps- repeat colonoscopy in 5 years- study just done recently by Dr. Henrene Pastor  Essential hypertension treated with Norvasc 5 mg daily and stable  Anxiety treated with Lexapro and Tranxene and stable  History of GE reflux treated with PPI  Plan: Work on diet and exercise.  Take lipid-lowering medication regularly.  Return in 1 year or as needed.  Continue current medications.

## 2020-03-16 NOTE — Patient Instructions (Addendum)
Weight has increased since visit in October 2025 by approximately 18 pounds.  Please work on diet and exercise regimen.  Immunizations are up-to-date including recent flu vaccine.  Recommend third Covid vaccine.  It was a pleasure to see you today.

## 2020-04-02 ENCOUNTER — Other Ambulatory Visit: Payer: Self-pay | Admitting: Internal Medicine

## 2020-04-27 DIAGNOSIS — D489 Neoplasm of uncertain behavior, unspecified: Secondary | ICD-10-CM | POA: Diagnosis not present

## 2020-04-27 DIAGNOSIS — L820 Inflamed seborrheic keratosis: Secondary | ICD-10-CM | POA: Diagnosis not present

## 2020-05-02 ENCOUNTER — Other Ambulatory Visit: Payer: Self-pay | Admitting: Internal Medicine

## 2020-07-31 ENCOUNTER — Other Ambulatory Visit: Payer: Self-pay | Admitting: Internal Medicine

## 2020-08-30 ENCOUNTER — Other Ambulatory Visit: Payer: Self-pay | Admitting: Internal Medicine

## 2020-08-30 NOTE — Telephone Encounter (Signed)
Please book CPE for October before refilling

## 2021-01-25 ENCOUNTER — Other Ambulatory Visit: Payer: Self-pay | Admitting: Internal Medicine

## 2021-03-16 ENCOUNTER — Other Ambulatory Visit: Payer: BC Managed Care – PPO | Admitting: Internal Medicine

## 2021-03-16 ENCOUNTER — Other Ambulatory Visit: Payer: Self-pay

## 2021-03-16 DIAGNOSIS — E7800 Pure hypercholesterolemia, unspecified: Secondary | ICD-10-CM

## 2021-03-16 DIAGNOSIS — I10 Essential (primary) hypertension: Secondary | ICD-10-CM | POA: Diagnosis not present

## 2021-03-16 DIAGNOSIS — Z125 Encounter for screening for malignant neoplasm of prostate: Secondary | ICD-10-CM | POA: Diagnosis not present

## 2021-03-16 DIAGNOSIS — Z0000 Encounter for general adult medical examination without abnormal findings: Secondary | ICD-10-CM

## 2021-03-17 LAB — CBC WITH DIFFERENTIAL/PLATELET
Absolute Monocytes: 533 cells/uL (ref 200–950)
Basophils Absolute: 50 cells/uL (ref 0–200)
Basophils Relative: 0.8 %
Eosinophils Absolute: 143 cells/uL (ref 15–500)
Eosinophils Relative: 2.3 %
HCT: 46.7 % (ref 38.5–50.0)
Hemoglobin: 16.3 g/dL (ref 13.2–17.1)
Lymphs Abs: 1550 cells/uL (ref 850–3900)
MCH: 31.1 pg (ref 27.0–33.0)
MCHC: 34.9 g/dL (ref 32.0–36.0)
MCV: 89.1 fL (ref 80.0–100.0)
MPV: 9.5 fL (ref 7.5–12.5)
Monocytes Relative: 8.6 %
Neutro Abs: 3925 cells/uL (ref 1500–7800)
Neutrophils Relative %: 63.3 %
Platelets: 236 10*3/uL (ref 140–400)
RBC: 5.24 10*6/uL (ref 4.20–5.80)
RDW: 12.4 % (ref 11.0–15.0)
Total Lymphocyte: 25 %
WBC: 6.2 10*3/uL (ref 3.8–10.8)

## 2021-03-17 LAB — COMPLETE METABOLIC PANEL WITH GFR
AG Ratio: 1.6 (calc) (ref 1.0–2.5)
ALT: 48 U/L — ABNORMAL HIGH (ref 9–46)
AST: 39 U/L — ABNORMAL HIGH (ref 10–35)
Albumin: 4.3 g/dL (ref 3.6–5.1)
Alkaline phosphatase (APISO): 75 U/L (ref 35–144)
BUN: 8 mg/dL (ref 7–25)
CO2: 26 mmol/L (ref 20–32)
Calcium: 9.4 mg/dL (ref 8.6–10.3)
Chloride: 103 mmol/L (ref 98–110)
Creat: 0.74 mg/dL (ref 0.70–1.35)
Globulin: 2.7 g/dL (calc) (ref 1.9–3.7)
Glucose, Bld: 96 mg/dL (ref 65–99)
Potassium: 4.2 mmol/L (ref 3.5–5.3)
Sodium: 139 mmol/L (ref 135–146)
Total Bilirubin: 0.6 mg/dL (ref 0.2–1.2)
Total Protein: 7 g/dL (ref 6.1–8.1)
eGFR: 102 mL/min/{1.73_m2} (ref >=60)

## 2021-03-17 LAB — LIPID PANEL
Cholesterol: 190 mg/dL (ref <200)
HDL: 71 mg/dL (ref >=40)
LDL Cholesterol (Calc): 103 mg/dL (calc) — ABNORMAL HIGH
Non-HDL Cholesterol (Calc): 119 mg/dL (calc) (ref <130)
Total CHOL/HDL Ratio: 2.7 (calc) (ref <5.0)
Triglycerides: 70 mg/dL (ref <150)

## 2021-03-17 LAB — PSA: PSA: 0.49 ng/mL (ref <=4.00)

## 2021-03-19 ENCOUNTER — Other Ambulatory Visit: Payer: Self-pay

## 2021-03-19 ENCOUNTER — Encounter: Payer: Self-pay | Admitting: Internal Medicine

## 2021-03-19 ENCOUNTER — Ambulatory Visit (INDEPENDENT_AMBULATORY_CARE_PROVIDER_SITE_OTHER): Payer: BC Managed Care – PPO | Admitting: Internal Medicine

## 2021-03-19 VITALS — BP 130/90 | HR 76 | Temp 98.4°F | Ht 72.5 in | Wt 218.0 lb

## 2021-03-19 DIAGNOSIS — E7800 Pure hypercholesterolemia, unspecified: Secondary | ICD-10-CM

## 2021-03-19 DIAGNOSIS — Z0000 Encounter for general adult medical examination without abnormal findings: Secondary | ICD-10-CM | POA: Diagnosis not present

## 2021-03-19 DIAGNOSIS — J0100 Acute maxillary sinusitis, unspecified: Secondary | ICD-10-CM

## 2021-03-19 DIAGNOSIS — K649 Unspecified hemorrhoids: Secondary | ICD-10-CM

## 2021-03-19 DIAGNOSIS — Z6829 Body mass index (BMI) 29.0-29.9, adult: Secondary | ICD-10-CM

## 2021-03-19 DIAGNOSIS — Z79899 Other long term (current) drug therapy: Secondary | ICD-10-CM

## 2021-03-19 DIAGNOSIS — Z86010 Personal history of colonic polyps: Secondary | ICD-10-CM | POA: Diagnosis not present

## 2021-03-19 DIAGNOSIS — Z8659 Personal history of other mental and behavioral disorders: Secondary | ICD-10-CM

## 2021-03-19 DIAGNOSIS — Z8719 Personal history of other diseases of the digestive system: Secondary | ICD-10-CM

## 2021-03-19 DIAGNOSIS — J309 Allergic rhinitis, unspecified: Secondary | ICD-10-CM

## 2021-03-19 DIAGNOSIS — Z860101 Personal history of adenomatous and serrated colon polyps: Secondary | ICD-10-CM

## 2021-03-19 DIAGNOSIS — I10 Essential (primary) hypertension: Secondary | ICD-10-CM

## 2021-03-19 LAB — POCT URINALYSIS DIPSTICK
Bilirubin, UA: NEGATIVE
Blood, UA: NEGATIVE
Glucose, UA: NEGATIVE
Ketones, UA: NEGATIVE
Leukocytes, UA: NEGATIVE
Nitrite, UA: NEGATIVE
Protein, UA: NEGATIVE
Spec Grav, UA: <=1.005 — AB (ref 1.010–1.025)
Urobilinogen, UA: 0.2 E.U./dL
pH, UA: 6.5 (ref 5.0–8.0)

## 2021-03-19 MED ORDER — AZITHROMYCIN 250 MG PO TABS: ORAL_TABLET | ORAL | 1 refills | Status: AC

## 2021-03-19 MED ORDER — PREDNISONE 10 MG PO TABS: ORAL_TABLET | ORAL | 0 refills | Status: DC

## 2021-03-19 NOTE — Progress Notes (Signed)
Subjective:    Patient ID: Joel Coffey, male    DOB: 20-Jul-1960, 60 y.o.   MRN: [id number]  HPI 60 year old Male for health maintenance exam and evaluation of medical issues.  Hx of pure hypercholesterolemia.  Has had issues recently with bleeding hemorrhoids recently. Had colonoscopy by Dr. Henrene Pastor in 2021.  Says that he thinks he has a sinus infection.  He has had maxillary sinus pressure and some discolored nasal drainage.  Will be treated with Zithromax Z-PAK with 1 refill and a tapering course of prednisone.  Has had some issues recently with bleeding hemorrhoids.  Has been using over-the-counter preparations.  History of hyperlipidemia, mild depression, hypertension, GE reflux.  Takes Claritin as needed for allergic rhinitis symptoms.  Has a history of migraine headaches but seldom has them anymore.  History of mitral regurgitation but is asymptomatic and history of allergic rhinitis.  History of scrotal pain 2004 treated for presumed epididymitis.  At one point there was a question of Augmentin allergy but he went to an allergist and had formal testing for Penicillin allergy.  He was found not to have Penicillin allergy.  History of Transient global amnesia in the Fall 2019.  He recovered quickly.  He saw Dr. Jannifer Franklin, Neurologist who found no focal deficits.  He has not had a recurrence.  History of cardiac murmur dating back to the 1990s.  Has been told he does not need SBE prophylaxis.  He saw Dr. Verl Blalock in 2007.  2D echocardiogram showed trivial mitral valve regurgitation.  No evidence of mitral stenosis or mitral valve prolapse.  Overall left ventricular systolic function was normal.  Additional past medical history: Vasectomy 2002.  Allergy testing done in 2009 showed very strong reactions to multiple grasses and tree pollens.  He had mild reactivity to selected molds, dust mite, cat dander and cockroach.  Social history: He is married.  2 adult children, a son and a daughter.   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  Patient is a non-smoker.  Social alcohol consumption.  He has mild elevation of liver functions today.  Does drink beer.  This is likely the cause of his mildly elevated liver functions.    Review of Systems has post nsasl drip with discolored drainage     Objective:   Physical Exam Temperature 98.4 degrees.   Weight  218 pounds- has gained 4 pounds since last year Blood pressure 130/90 pulse 76 regular temperature 98.4 degrees pulse oximetry 97%, BMI 29.16  Skin: Warm and dry.  No cervical adenopathy.  TMs are clear.  Pharynx is clear.  Neck is supple.  No JVD thyromegaly or carotid bruits.  Chest is clear to auscultation without rales or wheezing.  Cardiac exam regular rate and rhythm normal S1 and S2.  There is a 1/6  systolic ejection murmur heard along left sternal border.  He has a history of mitral regurgitation.  Abdomen soft nondistended without hepatosplenomegaly masses or tenderness.  Prostate is normal.  Stool is guaiac positive and dark red blood noted on exam glove.  This he says is from hemorrhoids.     Assessment & Plan:  Rectal bleeding likely from hemorrhoids-patient to monitor this closely and let me know if it is not improving.  May use over-the-counter preparations.  History of mitral regurgitation-stable and asymptomatic  Allergic rhinitis due to multiple allergens  Acute maxillary sinusitis which will be treated with a tapering course of prednisone and Zithromax  Z-Pak  History of Transient global amnesia in 2019  History of adenomatous colon polyps.  Colonoscopy is up-to-date.  Essential hypertension treated with Norvasc daily and stable  Anxiety treated with Lexapro and Tranxene.  Anxiety is stable with these 2 medications  Pure hypercholesterolemia treated with statin  History of GE reflux treated with PPI  BMI 29.16- try to lose a little weight  Elevated LFTs- watch ETOH  consumption a bit  Plan: He will continue with current medications.  He will watch diet and exercise more.  Needs to lose a bit of weight.  Have prescribed Zithromax and Prednisone taper for sinusitis. Call if hemorrhoids not improving. RTC in one year or as needed.Continue current meds as prescribed.

## 2021-03-19 NOTE — Patient Instructions (Addendum)
It was a pleasure to see you today.  Take Zithromax Z-PAK as directed and prednisone taper for acute maxillary sinusitis.  Call if hemorrhoids not resolving in the next few days.  Continue medication for reflux and hypertension.  Continue  anxiety medications.  Return in 1 year or as needed.

## 2021-03-19 NOTE — Progress Notes (Signed)
anm

## 2021-03-26 ENCOUNTER — Other Ambulatory Visit: Payer: Self-pay | Admitting: Internal Medicine

## 2021-04-25 ENCOUNTER — Other Ambulatory Visit: Payer: Self-pay | Admitting: Internal Medicine

## 2021-05-16 ENCOUNTER — Other Ambulatory Visit: Payer: Self-pay | Admitting: Internal Medicine

## 2021-08-01 ENCOUNTER — Other Ambulatory Visit: Payer: Self-pay | Admitting: Internal Medicine

## 2021-08-30 ENCOUNTER — Encounter: Payer: Self-pay | Admitting: Internal Medicine

## 2021-08-30 ENCOUNTER — Telehealth: Payer: Self-pay | Admitting: Internal Medicine

## 2021-08-30 ENCOUNTER — Ambulatory Visit: Payer: BC Managed Care – PPO | Admitting: Internal Medicine

## 2021-08-30 VITALS — BP 118/76 | HR 73 | Temp 97.2°F | Ht 72.5 in | Wt 220.0 lb

## 2021-08-30 DIAGNOSIS — J22 Unspecified acute lower respiratory infection: Secondary | ICD-10-CM

## 2021-08-30 MED ORDER — DOXYCYCLINE HYCLATE 100 MG PO TABS: 100.0000 mg | ORAL_TABLET | Freq: Two times a day (BID) | ORAL | 0 refills | Status: DC

## 2021-08-30 MED ORDER — PREDNISONE 10 MG PO TABS: ORAL_TABLET | ORAL | 0 refills | Status: DC

## 2021-08-30 NOTE — Telephone Encounter (Signed)
Deep Bonawitz ?[phone] ? ?Pearse called to say he has Productive Cough, Head Congestion, Feels Flush at times, he is going to do home COVID test and call back. I have scheduled him at 4:00 today. ?

## 2021-08-30 NOTE — Progress Notes (Signed)
? ?  Subjective:  ? ? Patient ID: Joel Coffey, male    DOB: [birth year], 61 y.o.   MRN: [id number] ? ?HPI 61 year old Male seen for an acute visit with productive cough, head congestion and at times feels flushed.  Did a COVID test at home and result was negative.  No known COVID exposure.  He thought maybe he had allergies but this seems to be a little more than just some mild allergy symptoms.  Has occasional bouts of sinusitis. ? ?History of pure hypercholesterolemia.  History of transient global amnesia in October 2019.  History of migraine headaches, allergic rhinitis and mitral regurgitation. ? ?Takes Norvasc for mild hypertension and Zocor for hyperlipidemia. ? ? ? ?Review of Systems see above ? ?   ?Objective:  ? Physical Exam ?Blood pressure 118/76 pulse 73 temperature 97.2 degrees.  Pulse oximetry 95% weight 220 pounds BMI 29.43. ? ?He is not tachypneic and is in no acute distress.  Skin warm and dry.  Pharynx is clear.  Neck is supple.  Chest is clear to auscultation.  TMs clear. ? ?   ?Assessment & Plan:  ? ?Acute respiratory infection ? ?Plan: Doxycycline 100 mg twice daily for 10 days.  Prednisone 10 mg tablets to take in tapering course as directed starting with 6 tablets day 1 and decreasing by 1 tablet daily i.e. 6-5-4-3-2-1 taper. ?

## 2021-09-16 NOTE — Patient Instructions (Signed)
You have been diagnosed with an acute respiratory infection.  Please take prednisone 10 mg tablets in tapering course as directed and doxycycline 100 mg twice daily for 10 days.  Rest and stay well-hydrated and call if symptoms not improving in a few days or sooner if worse.  It was a pleasure to see you today and sorry you are not feeling well. ?

## 2021-09-22 ENCOUNTER — Other Ambulatory Visit: Payer: Self-pay | Admitting: Internal Medicine

## 2022-01-23 ENCOUNTER — Other Ambulatory Visit: Payer: Self-pay | Admitting: Internal Medicine

## 2022-03-15 ENCOUNTER — Other Ambulatory Visit: Payer: BC Managed Care – PPO

## 2022-03-15 DIAGNOSIS — E7800 Pure hypercholesterolemia, unspecified: Secondary | ICD-10-CM | POA: Diagnosis not present

## 2022-03-15 DIAGNOSIS — E785 Hyperlipidemia, unspecified: Secondary | ICD-10-CM | POA: Diagnosis not present

## 2022-03-15 DIAGNOSIS — I10 Essential (primary) hypertension: Secondary | ICD-10-CM | POA: Diagnosis not present

## 2022-03-15 DIAGNOSIS — Z125 Encounter for screening for malignant neoplasm of prostate: Secondary | ICD-10-CM

## 2022-03-15 DIAGNOSIS — Z8659 Personal history of other mental and behavioral disorders: Secondary | ICD-10-CM

## 2022-03-16 LAB — CBC WITH DIFFERENTIAL/PLATELET
Absolute Monocytes: 419 cells/uL (ref 200–950)
Basophils Absolute: 48 cells/uL (ref 0–200)
Basophils Relative: 0.9 %
Eosinophils Absolute: 122 cells/uL (ref 15–500)
Eosinophils Relative: 2.3 %
HCT: 44.4 % (ref 38.5–50.0)
Hemoglobin: 15.8 g/dL (ref 13.2–17.1)
Lymphs Abs: 1325 cells/uL (ref 850–3900)
MCH: 31.7 pg (ref 27.0–33.0)
MCHC: 35.6 g/dL (ref 32.0–36.0)
MCV: 89 fL (ref 80.0–100.0)
MPV: 9.5 fL (ref 7.5–12.5)
Monocytes Relative: 7.9 %
Neutro Abs: 3387 cells/uL (ref 1500–7800)
Neutrophils Relative %: 63.9 %
Platelets: 202 10*3/uL (ref 140–400)
RBC: 4.99 10*6/uL (ref 4.20–5.80)
RDW: 12.6 % (ref 11.0–15.0)
Total Lymphocyte: 25 %
WBC: 5.3 10*3/uL (ref 3.8–10.8)

## 2022-03-16 LAB — COMPLETE METABOLIC PANEL WITH GFR
AG Ratio: 1.8 (calc) (ref 1.0–2.5)
ALT: 37 U/L (ref 9–46)
AST: 32 U/L (ref 10–35)
Albumin: 4.2 g/dL (ref 3.6–5.1)
Alkaline phosphatase (APISO): 68 U/L (ref 35–144)
BUN/Creatinine Ratio: 12 (calc) (ref 6–22)
BUN: 8 mg/dL (ref 7–25)
CO2: 29 mmol/L (ref 20–32)
Calcium: 9 mg/dL (ref 8.6–10.3)
Chloride: 101 mmol/L (ref 98–110)
Creat: 0.66 mg/dL — ABNORMAL LOW (ref 0.70–1.35)
Globulin: 2.4 g/dL (calc) (ref 1.9–3.7)
Glucose, Bld: 98 mg/dL (ref 65–99)
Potassium: 4 mmol/L (ref 3.5–5.3)
Sodium: 137 mmol/L (ref 135–146)
Total Bilirubin: 0.7 mg/dL (ref 0.2–1.2)
Total Protein: 6.6 g/dL (ref 6.1–8.1)
eGFR: 105 mL/min/{1.73_m2} (ref >=60)

## 2022-03-16 LAB — LIPID PANEL
Cholesterol: 186 mg/dL (ref <200)
HDL: 68 mg/dL (ref >=40)
LDL Cholesterol (Calc): 103 mg/dL (calc) — ABNORMAL HIGH
Non-HDL Cholesterol (Calc): 118 mg/dL (calc) (ref <130)
Total CHOL/HDL Ratio: 2.7 (calc) (ref <5.0)
Triglycerides: 64 mg/dL (ref <150)

## 2022-03-16 LAB — PSA: PSA: 0.71 ng/mL (ref <=4.00)

## 2022-03-20 ENCOUNTER — Other Ambulatory Visit: Payer: Self-pay

## 2022-03-20 MED ORDER — AMLODIPINE BESYLATE 5 MG PO TABS: ORAL_TABLET | ORAL | 1 refills | Status: DC

## 2022-03-21 ENCOUNTER — Encounter: Payer: Self-pay | Admitting: Internal Medicine

## 2022-03-21 ENCOUNTER — Ambulatory Visit (INDEPENDENT_AMBULATORY_CARE_PROVIDER_SITE_OTHER): Payer: BC Managed Care – PPO | Admitting: Internal Medicine

## 2022-03-21 VITALS — BP 116/76 | HR 79 | Temp 98.2°F | Ht 73.0 in | Wt 220.4 lb

## 2022-03-21 DIAGNOSIS — E7800 Pure hypercholesterolemia, unspecified: Secondary | ICD-10-CM | POA: Diagnosis not present

## 2022-03-21 DIAGNOSIS — Z6829 Body mass index (BMI) 29.0-29.9, adult: Secondary | ICD-10-CM

## 2022-03-21 DIAGNOSIS — J309 Allergic rhinitis, unspecified: Secondary | ICD-10-CM

## 2022-03-21 DIAGNOSIS — Z8659 Personal history of other mental and behavioral disorders: Secondary | ICD-10-CM | POA: Diagnosis not present

## 2022-03-21 DIAGNOSIS — I10 Essential (primary) hypertension: Secondary | ICD-10-CM

## 2022-03-21 DIAGNOSIS — Z79899 Other long term (current) drug therapy: Secondary | ICD-10-CM | POA: Diagnosis not present

## 2022-03-21 DIAGNOSIS — Z0000 Encounter for general adult medical examination without abnormal findings: Secondary | ICD-10-CM

## 2022-03-21 DIAGNOSIS — Z86010 Personal history of colonic polyps: Secondary | ICD-10-CM

## 2022-03-21 DIAGNOSIS — Z8719 Personal history of other diseases of the digestive system: Secondary | ICD-10-CM

## 2022-03-21 LAB — POCT URINALYSIS DIPSTICK
Bilirubin, UA: NEGATIVE
Blood, UA: NEGATIVE
Glucose, UA: NEGATIVE
Ketones, UA: NEGATIVE
Leukocytes, UA: NEGATIVE
Nitrite, UA: NEGATIVE
Protein, UA: NEGATIVE
Spec Grav, UA: 1.01 (ref 1.010–1.025)
Urobilinogen, UA: 0.2 E.U./dL
pH, UA: 7 (ref 5.0–8.0)

## 2022-03-21 MED ORDER — PREDNISONE 10 MG PO TABS: ORAL_TABLET | ORAL | 0 refills | Status: DC

## 2022-03-21 NOTE — Progress Notes (Signed)
Subjective:    Patient ID: Joel Coffey, male    DOB: 1960/07/12, 61 y.o.   MRN: [id number]  HPI 61 year old Male for health maintenance exam and evaluation of medical issues.  History of pure hypercholesterolemia.  History of bleeding hemorrhoids.  Has used over-the-counter preparations.  Patient had colonoscopy by Dr. Henrene Pastor in 2021.  History of hyperlipidemia, mild depression, hypertension and GE reflux.  Takes Claritin as needed for allergic rhinitis symptoms.  History of migraine headaches but seldom has them anymore.  History of mild mitral regurgitation but is asymptomatic.  History of allergic rhinitis.  History of scrotal pain in 2004 treated for presumed epididymitis.  At 1 point, there was a question of Augmentin allergy but he went to an allergist and had formal testing for penicillin allergy.  He was found not to have penicillin allergy.  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  He recovered quickly.  He saw Dr. Jannifer Franklin, neurologist who found no focal deficits.  He has not had recurrence.  History of cardiac murmur dating back to the 1990s.  Has been told he does not need SBE prophylaxis.  He saw Dr. Verl Blalock in 2007.  2D echocardiogram showed trivial mitral valve regurgitation.  No evidence of mitral stenosis or mitral valve prolapse.  Overall left ventricular systolic function was normal.  Labs are reviewed.  His LDL is 103, CBC is normal.  PSA is normal.  Liver functions are normal.  Electrolytes are normal.  Additional past medical history: Vasectomy 2002.  Allergy testing done in 2009 showed very strong reactions to multiple grasses and tree pollens.  He had mild reactivity to selected molds, dust mite, cat dander and cockroach.  Social history: He is married.  2 adult children, a son and a daughter.  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  Patient is a non-smoker.  Social alcohol consumption.  Family history: Father died at age 20 of  lymphoma.  Mother died in 2016-04-27 at age 61 with complications of dementia.  1 brother died of ruptured esophagus.  3 brothers.  No sisters.  Vaccines discussed  Review of Systems no new complaints     Objective:   Physical Exam Blood pressure 116/76, pulse 79, temperature 98.2 degrees pulse oximetry 97% weight 220 pounds 6.4 ounces height 6 feet 1 inches BMI 29.08  Skin: Warm and dry.  No cervical adenopathy, thyromegaly or carotid bruits.  TMs clear.  Pharynx is clear.  Neck is supple.  Chest clear.  Cardiac exam: Regular rate and rhythm.  1/6 systolic ejection murmur.  Abdomen: Soft, nondistended without hepatosplenomegaly, masses, or tenderness.  Prostate is normal without nodules.  No stool to guaiac.  No lower extremity pitting edema.  Brief neurological exam is intact without gross focal deficits.  Affect thought and judgment are normal.       Assessment & Plan:  Allergic rhinitis due to multiple allergens  History of transient global amnesia in 2019  History of adenomatous colon polyps and colonoscopy is up-to-date  Essential hypertension treated with generic Norvasc and stable  History of anxiety treated with Lexapro 10 mg daily  Pure hypercholesterolemia treated with statin  GE reflux treated with PPI history of mild mitral regurgitation-asymptomatic  BMI 29  Plan: He feels well and labs are stable.  Watch weight.  Given prednisone taper to have on hand if needed.  Continue Zocor for hyperlipidemia, Protonix for GE reflux, Claritin for allergies,  Norvasc for hypertension and Lexapro 10 mg daily.  Follow-up in 1 year or as needed.  Vaccines discussed.  Tetanus and flu vaccine up-to-date.  May consider COVID booster with new variant, pneumococcal 20 vaccine, Shingrix vaccine and RSV vaccine.  Colonoscopy was done in 2021.  Patient given prescription for prednisone taper  to have on hand if needed.

## 2022-03-21 NOTE — Patient Instructions (Addendum)
Labs are normal. Vaccines are discussed today. RTC in one year or as needed. Colonoscopy is up to date.  It was a pleasure to see you today.  Continue current medications.  Prednisone taper to have on hand at patient request.

## 2022-04-02 ENCOUNTER — Telehealth: Payer: Self-pay

## 2022-04-02 MED ORDER — SIMVASTATIN 10 MG PO TABS
ORAL_TABLET | ORAL | 3 refills | Status: DC
Start: 2022-04-02 — End: 2023-06-09

## 2022-04-02 NOTE — Telephone Encounter (Signed)
Rx refill

## 2022-05-22 ENCOUNTER — Other Ambulatory Visit: Payer: Self-pay

## 2022-05-22 MED ORDER — ESCITALOPRAM OXALATE 10 MG PO TABS: ORAL_TABLET | ORAL | 3 refills | Status: DC

## 2022-08-07 ENCOUNTER — Other Ambulatory Visit: Payer: Self-pay | Admitting: Internal Medicine

## 2022-08-08 NOTE — Telephone Encounter (Signed)
Left voicemail for patient to call the office to schedule CPE before any refills

## 2022-12-28 ENCOUNTER — Other Ambulatory Visit: Payer: Self-pay | Admitting: Internal Medicine

## 2023-01-28 ENCOUNTER — Ambulatory Visit: Payer: BC Managed Care – PPO | Admitting: Internal Medicine

## 2023-01-28 ENCOUNTER — Telehealth: Payer: Self-pay | Admitting: Internal Medicine

## 2023-01-28 ENCOUNTER — Encounter: Payer: Self-pay | Admitting: Internal Medicine

## 2023-01-28 VITALS — BP 120/80 | HR 88 | Temp 97.8°F | Ht 73.0 in | Wt 210.0 lb

## 2023-01-28 DIAGNOSIS — J0100 Acute maxillary sinusitis, unspecified: Secondary | ICD-10-CM

## 2023-01-28 MED ORDER — DOXYCYCLINE HYCLATE 100 MG PO TABS: 100.0000 mg | ORAL_TABLET | Freq: Two times a day (BID) | ORAL | 0 refills | Status: DC

## 2023-01-28 MED ORDER — BENZONATATE 100 MG PO CAPS: 100.0000 mg | ORAL_CAPSULE | Freq: Two times a day (BID) | ORAL | 0 refills | Status: DC | PRN

## 2023-01-28 MED ORDER — PREDNISONE 10 MG PO TABS: ORAL_TABLET | ORAL | 0 refills | Status: DC

## 2023-01-28 NOTE — Telephone Encounter (Signed)
COVID Test was negative, scheduled for 4:00

## 2023-01-28 NOTE — Telephone Encounter (Signed)
LVM to CB with COVID test results

## 2023-01-28 NOTE — Progress Notes (Signed)
Patient Care Team: Margaree Mackintosh, MD as PCP - General (Internal Medicine)  Visit Date: 01/28/23  Subjective:    Patient ID: Joel Coffey , Male   DOB: [birth year], 62 y.o.    MRN: [id number]   62 y.o. Male presents today for head congestion, cough with green sputum, fatigue, sneezing, sinus pressure. This is a recurring issue for him that happens multiple times every year. Reports negative at-home Covid-19 test. Denies fever, chills, sore throat.  Past Medical History:  Diagnosis Date   Heart murmur    Hyperlipidemia    Hypertension    Migraine headache    Mitral regurgitation    Vision abnormalities    Vitamin D deficiency      Family History  Problem Relation Age of Onset   Cancer Father    Colon cancer Neg Hx    Colon polyps Neg Hx    Esophageal cancer Neg Hx    Rectal cancer Neg Hx    Stomach cancer Neg Hx     Social hx: He is a Equities trader. 2 adult children, a son and a daughter. Wife is a Counsellor at [employer].     Review of Systems  Constitutional:  Negative for fever and malaise/fatigue.  HENT:  Positive for congestion (Head).   Eyes:  Negative for blurred vision.  Respiratory:  Positive for cough and sputum production (Green). Negative for shortness of breath.   Cardiovascular:  Negative for chest pain, palpitations and leg swelling.  Gastrointestinal:  Negative for vomiting.  Musculoskeletal:  Negative for back pain.  Skin:  Negative for rash.  Neurological:  Negative for loss of consciousness and headaches.        Objective:   Vitals: BP 120/80   Pulse 88   Temp 97.8 F (36.6 C)   Ht 6\' 1"  (1.854 m)   Wt 210 lb (95.3 kg)   SpO2 95%   BMI 27.71 kg/m    Physical Exam Vitals and nursing note reviewed.  Constitutional:      General: He is not in acute distress.    Appearance: Normal appearance. He is not ill-appearing.  HENT:     Head: Normocephalic and atraumatic.     Right Ear: Hearing, ear canal and  external ear normal.     Left Ear: Hearing, tympanic membrane, ear canal and external ear normal.     Ears:     Comments: Right TM obscured by cerumen.    Mouth/Throat:     Pharynx: Oropharynx is clear. No oropharyngeal exudate.  Pulmonary:     Effort: Pulmonary effort is normal. No respiratory distress.     Breath sounds: Normal breath sounds. No wheezing or rales.  Skin:    General: Skin is warm and dry.  Neurological:     Mental Status: He is alert and oriented to person, place, and time. Mental status is at baseline.  Psychiatric:        Mood and Affect: Mood normal.        Behavior: Behavior normal.        Thought Content: Thought content normal.        Judgment: Judgment normal.       Results:   Studies obtained and personally reviewed by me:   Labs:       Component Value Date/Time   NA 137 03/15/2022 0906   K 4.0 03/15/2022 0906   CL 101 03/15/2022 0906   CO2 29 03/15/2022 0906  GLUCOSE 98 03/15/2022 0906   BUN 8 03/15/2022 0906   CREATININE 0.66 (L) 03/15/2022 0906   CALCIUM 9.0 03/15/2022 0906   PROT 6.6 03/15/2022 0906   ALBUMIN 4.4 03/13/2018 0733   AST 32 03/15/2022 0906   ALT 37 03/15/2022 0906   ALKPHOS 59 03/13/2018 0733   BILITOT 0.7 03/15/2022 0906   GFRNONAA 101 03/14/2020 0956   GFRAA 117 03/14/2020 0956     Lab Results  Component Value Date   WBC 5.3 03/15/2022   HGB 15.8 03/15/2022   HCT 44.4 03/15/2022   MCV 89.0 03/15/2022   PLT 202 03/15/2022    Lab Results  Component Value Date   CHOL 186 03/15/2022   HDL 68 03/15/2022   LDLCALC 103 (H) 03/15/2022   TRIG 64 03/15/2022   CHOLHDL 2.7 03/15/2022        Lab Results  Component Value Date   PSA 0.71 03/15/2022   PSA 0.49 03/16/2021   PSA 0.49 03/14/2020      Assessment & Plan:   Acute Maxillary Sinusitis: prescribed doxycycline 100 mg twice daily, Tessalon 100 mg twice daily as needed for cough, prednisone tapering course take as directed. Get plenty of rest and stay  well-hydrated. Contact us if symptoms worsen or do not improve.    I,Alexander Ruley,acting as a Neurosurgeon for Margaree Mackintosh, MD.,have documented all relevant documentation on the behalf of Margaree Mackintosh, MD,as directed by  Margaree Mackintosh, MD while in the presence of Margaree Mackintosh, MD.   I, Margaree Mackintosh, MD, have reviewed all documentation for this visit. The documentation on 02/02/23 for the exam, diagnosis, procedures, and orders are all accurate and complete.

## 2023-01-28 NOTE — Telephone Encounter (Signed)
Joel Coffey [phone]  Trey Paula called to say he has Head & Chest Congestion, Cough, Tired, sneezing, I have ask him to do a COVID test and call me back, to determine what type of visit we will do.

## 2023-02-06 ENCOUNTER — Other Ambulatory Visit: Payer: Self-pay | Admitting: Internal Medicine

## 2023-02-17 DIAGNOSIS — J310 Chronic rhinitis: Secondary | ICD-10-CM | POA: Diagnosis not present

## 2023-02-17 DIAGNOSIS — J342 Deviated nasal septum: Secondary | ICD-10-CM | POA: Diagnosis not present

## 2023-02-17 DIAGNOSIS — J329 Chronic sinusitis, unspecified: Secondary | ICD-10-CM | POA: Diagnosis not present

## 2023-03-03 DIAGNOSIS — J342 Deviated nasal septum: Secondary | ICD-10-CM | POA: Diagnosis not present

## 2023-03-03 DIAGNOSIS — J329 Chronic sinusitis, unspecified: Secondary | ICD-10-CM | POA: Diagnosis not present

## 2023-03-03 DIAGNOSIS — J341 Cyst and mucocele of nose and nasal sinus: Secondary | ICD-10-CM | POA: Diagnosis not present

## 2023-03-10 NOTE — Progress Notes (Signed)
Patient Care Team: Margaree Mackintosh, MD as PCP - General (Internal Medicine)  Visit Date: 03/24/23  Subjective:    Patient ID: Joel Coffey , Male   DOB: 1960/11/14, 62 y.o.    MRN: [id number]   61 y.o. Male presents today for annual comprehensive physical exam. History of heart murmur, hyperlipidemia, hypertension, migraine headaches, mitral regurgitation, Vitamin D deficiency.   History of hyperlipidemia treated with simvastatin 10 mg daily. CHOL elevated at 200, LDL elevated at 119 on 03/20/23, up from 186 and 103 one year ago.  He has gained 8 pounds since 03/21/22.  Reports ears have been itching recently.  History of hypertension treated with amlodipine 5 mg daily. Blood pressure elevated in-office today at 140/90.  History of depression treated with escitalopram 10 mg daily.  History of GERD treated with pantoprazole 40 mg daily.  History of bleeding hemorrhoids.  Has used over-the-counter preparations. Takes Claritin as needed for allergic rhinitis symptoms.   History of migraine headaches but seldom has them anymore.  History of mild mitral regurgitation but is asymptomatic.     History of scrotal pain in 2003/04/03 treated for presumed epididymitis.   At one point, there was a question of Augmentin allergy but he went to an allergist and had formal testing for penicillin allergy.  He was found not to have penicillin allergy.   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  He recovered quickly.  He saw Dr. Anne Hahn, neurologist who found no focal deficits.  He has not had recurrence.   History of cardiac murmur dating back to the 1990s.  Has been told he does not need SBE prophylaxis.  He saw Dr. Daleen Squibb in 04/02/2006.  2D echocardiogram showed trivial mitral valve regurgitation.  No evidence of mitral stenosis or mitral valve prolapse.  Overall left ventricular systolic function was normal.   Additional past medical history: Vasectomy 04-02-2001.  Allergy testing done in 2008-04-02 showed very  strong reactions to multiple grasses and tree pollens.  He had mild reactivity to selected molds, dust mite, cat dander and cockroach.  Labs reviewed today. Glucose elevated at 102. BUN low at 6. AST elevated at 55. ALT elevated at 75. He believes he was drinking too much beer but has switched to non-alcoholic beer recently. PSA at 0.41. CBC normal.   Patient had colonoscopy by Dr. Marina Goodell in Apr 02, 2020.   Social history: He is married.  2 adult children, a son and a daughter.  He is a Cytogeneticist.  Wife is a Sports administrator.  Patient is a non-smoker.  Social alcohol consumption.   Family history: Father died at age 47 of lymphoma.  Mother died in November 07, 2017at age 34 with complications of dementia.  1 brother died of ruptured esophagus.  3 brothers.  No sisters.  Past Medical History:  Diagnosis Date   Heart murmur    Hyperlipidemia    Hypertension    Migraine headache    Mitral regurgitation    Vision abnormalities    Vitamin D deficiency      Family History  Problem Relation Age of Onset   Cancer Father    Colon cancer Neg Hx    Colon polyps Neg Hx    Esophageal cancer Neg Hx    Rectal cancer Neg Hx    Stomach cancer Neg Hx     Social Hx: Married. Wife is a Sports administrator. He is a Cytogeneticist. 2 adult children, a  son and a daughter. Non smoker. Social alcohol consumption.     Review of Systems  Constitutional:  Negative for chills, fever, malaise/fatigue and weight loss.  HENT:  Negative for hearing loss, sinus pain and sore throat.        (+) Itching ears  Respiratory:  Negative for cough, hemoptysis and shortness of breath.   Cardiovascular:  Negative for chest pain, palpitations, leg swelling and PND.  Gastrointestinal:  Negative for abdominal pain, constipation, diarrhea, heartburn, nausea and vomiting.  Genitourinary:  Negative for dysuria, frequency and urgency.  Musculoskeletal:  Negative for back pain, myalgias and  neck pain.  Skin:  Negative for itching and rash.  Neurological:  Negative for dizziness, tingling, seizures and headaches.  Endo/Heme/Allergies:  Negative for polydipsia.  Psychiatric/Behavioral:  Negative for depression. The patient is not nervous/anxious.         Objective:   Vitals: BP (!) 140/90   Pulse 70   Ht 6\' 1"  (1.854 m)   Wt 228 lb (103.4 kg)   SpO2 97%   BMI 30.08 kg/m    Physical Exam Vitals and nursing note reviewed.  Constitutional:      General: He is awake. He is not in acute distress.    Appearance: Normal appearance. He is not ill-appearing or toxic-appearing.  HENT:     Head: Normocephalic and atraumatic.     Right Ear: Hearing, tympanic membrane, ear canal and external ear normal.     Left Ear: Hearing, tympanic membrane, ear canal and external ear normal.     Ears:     Comments: Some cerumen present bilateral external ear canals.    Mouth/Throat:     Pharynx: Oropharynx is clear.  Eyes:     Extraocular Movements: Extraocular movements intact.     Pupils: Pupils are equal, round, and reactive to light.  Neck:     Thyroid: No thyroid mass, thyromegaly or thyroid tenderness.     Vascular: No carotid bruit.  Cardiovascular:     Rate and Rhythm: Normal rate and regular rhythm. No extrasystoles are present.    Pulses:          Dorsalis pedis pulses are 2+ on the right side and 2+ on the left side.     Heart sounds: Normal heart sounds. No murmur heard.    No friction rub. No gallop.  Pulmonary:     Effort: Pulmonary effort is normal.     Breath sounds: Normal breath sounds. No decreased breath sounds, wheezing, rhonchi or rales.  Chest:     Chest wall: No mass.  Abdominal:     Palpations: Abdomen is soft.     Tenderness: There is no abdominal tenderness.     Hernia: No hernia is present.     Comments: Abdomen obese.  Genitourinary:    Prostate: Normal. Not enlarged and no nodules present.     Comments: Prostate smooth and  symmetrical. Musculoskeletal:     Cervical back: Normal range of motion.     Right lower leg: No edema.     Left lower leg: No edema.  Lymphadenopathy:     Cervical: No cervical adenopathy.     Upper Body:     Right upper body: No supraclavicular adenopathy.     Left upper body: No supraclavicular adenopathy.  Skin:    General: Skin is warm and dry.  Neurological:     General: No focal deficit present.     Mental Status: He is alert and oriented to  person, place, and time. Mental status is at baseline.     Cranial Nerves: Cranial nerves 2-12 are intact.     Sensory: Sensation is intact.     Motor: Motor function is intact.     Coordination: Coordination is intact.     Gait: Gait is intact.     Deep Tendon Reflexes: Reflexes are normal and symmetric.  Psychiatric:        Attention and Perception: Attention normal.        Mood and Affect: Mood normal.        Speech: Speech normal.        Behavior: Behavior normal. Behavior is cooperative.        Thought Content: Thought content normal.        Cognition and Memory: Cognition and memory normal.        Judgment: Judgment normal.       Results:   Studies obtained and personally reviewed by me:  Patient had colonoscopy by Dr. Marina Goodell in 2021.   Labs:       Component Value Date/Time   NA 138 03/20/2023 0907   K 4.3 03/20/2023 0907   CL 101 03/20/2023 0907   CO2 27 03/20/2023 0907   GLUCOSE 102 (H) 03/20/2023 0907   BUN 6 (L) 03/20/2023 0907   CREATININE 0.77 03/20/2023 0907   CALCIUM 9.3 03/20/2023 0907   PROT 6.7 03/20/2023 0907   ALBUMIN 4.4 03/13/2018 0733   AST 55 (H) 03/20/2023 0907   ALT 75 (H) 03/20/2023 0907   ALKPHOS 59 03/13/2018 0733   BILITOT 0.6 03/20/2023 0907   GFRNONAA 101 03/14/2020 0956   GFRAA 117 03/14/2020 0956     Lab Results  Component Value Date   WBC 5.9 03/20/2023   HGB 15.9 03/20/2023   HCT 46.4 03/20/2023   MCV 90.4 03/20/2023   PLT 230 03/20/2023    Lab Results  Component Value  Date   CHOL 200 (H) 03/20/2023   HDL 65 03/20/2023   LDLCALC 119 (H) 03/20/2023   TRIG 65 03/20/2023   CHOLHDL 3.1 03/20/2023    No results found for: "HGBA1C"   No results found for: "TSH"   Lab Results  Component Value Date   PSA 0.41 03/20/2023   PSA 0.71 03/15/2022   PSA 0.49 03/16/2021      Assessment & Plan:   Hyperlipidemia: treated with simvastatin 10 mg daily. CHOL elevated at 200, LDL elevated at 119 on 03/20/23, up from 186 and 103 one year ago. Watch diet and try to exercise a bit more.  Check lipid panel in 6 months as LDL is elevated  Hypertension: treated with amlodipine 5 mg daily. Blood pressure elevated in-office today at 140/90.Monitor BP at home should be 130/80 or less.  Depression: stable with escitalopram 10 mg daily.  GERD: stable with pantoprazole 40 mg daily.  Allergic rhinitis: treated with Claritin.  History of anxiety treated with Lexapro and stable.  Urinalysis normal today.   Patient had colonoscopy by Dr. Marina Goodell in 2021.   Vaccine counseling: suggested shingles vaccine. UTD on tetanus vaccine. Administered flu vaccine in office today.  Return in 6 months for liver and lipid panels as well as Hgb A1c recheck.  Monitor BP at home and let me know if persistently elevated.  I,Alexander Ruley,acting as a Neurosurgeon for Margaree Mackintosh, MD.,have documented all relevant documentation on the behalf of Margaree Mackintosh, MD,as directed by  Margaree Mackintosh, MD while in the presence of  Margaree Mackintosh, MD.   I, Margaree Mackintosh, MD, have reviewed all documentation for this visit. The documentation on 03/24/23 for the exam, diagnosis, procedures, and orders are all accurate and complete.

## 2023-03-20 ENCOUNTER — Other Ambulatory Visit: Payer: BC Managed Care – PPO

## 2023-03-20 DIAGNOSIS — Z125 Encounter for screening for malignant neoplasm of prostate: Secondary | ICD-10-CM | POA: Diagnosis not present

## 2023-03-20 DIAGNOSIS — E7800 Pure hypercholesterolemia, unspecified: Secondary | ICD-10-CM

## 2023-03-20 DIAGNOSIS — Z0000 Encounter for general adult medical examination without abnormal findings: Secondary | ICD-10-CM | POA: Diagnosis not present

## 2023-03-20 DIAGNOSIS — I10 Essential (primary) hypertension: Secondary | ICD-10-CM

## 2023-03-21 LAB — COMPLETE METABOLIC PANEL WITH GFR
AG Ratio: 1.8 (calc) (ref 1.0–2.5)
ALT: 75 U/L — ABNORMAL HIGH (ref 9–46)
AST: 55 U/L — ABNORMAL HIGH (ref 10–35)
Albumin: 4.3 g/dL (ref 3.6–5.1)
Alkaline phosphatase (APISO): 88 U/L (ref 35–144)
BUN/Creatinine Ratio: 8 (calc) (ref 6–22)
BUN: 6 mg/dL — ABNORMAL LOW (ref 7–25)
CO2: 27 mmol/L (ref 20–32)
Calcium: 9.3 mg/dL (ref 8.6–10.3)
Chloride: 101 mmol/L (ref 98–110)
Creat: 0.77 mg/dL (ref 0.70–1.35)
Globulin: 2.4 g/dL (ref 1.9–3.7)
Glucose, Bld: 102 mg/dL — ABNORMAL HIGH (ref 65–99)
Potassium: 4.3 mmol/L (ref 3.5–5.3)
Sodium: 138 mmol/L (ref 135–146)
Total Bilirubin: 0.6 mg/dL (ref 0.2–1.2)
Total Protein: 6.7 g/dL (ref 6.1–8.1)
eGFR: 100 mL/min/{1.73_m2} (ref >=60)

## 2023-03-21 LAB — CBC WITH DIFFERENTIAL/PLATELET
Absolute Lymphocytes: 1469 {cells}/uL (ref 850–3900)
Absolute Monocytes: 419 {cells}/uL (ref 200–950)
Basophils Absolute: 59 {cells}/uL (ref 0–200)
Basophils Relative: 1 %
Eosinophils Absolute: 159 {cells}/uL (ref 15–500)
Eosinophils Relative: 2.7 %
HCT: 46.4 % (ref 38.5–50.0)
Hemoglobin: 15.9 g/dL (ref 13.2–17.1)
MCH: 31 pg (ref 27.0–33.0)
MCHC: 34.3 g/dL (ref 32.0–36.0)
MCV: 90.4 fL (ref 80.0–100.0)
MPV: 9.7 fL (ref 7.5–12.5)
Monocytes Relative: 7.1 %
Neutro Abs: 3794 {cells}/uL (ref 1500–7800)
Neutrophils Relative %: 64.3 %
Platelets: 230 10*3/uL (ref 140–400)
RBC: 5.13 10*6/uL (ref 4.20–5.80)
RDW: 12.7 % (ref 11.0–15.0)
Total Lymphocyte: 24.9 %
WBC: 5.9 10*3/uL (ref 3.8–10.8)

## 2023-03-21 LAB — LIPID PANEL
Cholesterol: 200 mg/dL — ABNORMAL HIGH (ref <200)
HDL: 65 mg/dL (ref >=40)
LDL Cholesterol (Calc): 119 mg/dL — ABNORMAL HIGH
Non-HDL Cholesterol (Calc): 135 mg/dL — ABNORMAL HIGH (ref <130)
Total CHOL/HDL Ratio: 3.1 (calc) (ref <5.0)
Triglycerides: 65 mg/dL (ref <150)

## 2023-03-21 LAB — PSA: PSA: 0.41 ng/mL (ref <=4.00)

## 2023-03-24 ENCOUNTER — Encounter: Payer: Self-pay | Admitting: Internal Medicine

## 2023-03-24 ENCOUNTER — Ambulatory Visit (INDEPENDENT_AMBULATORY_CARE_PROVIDER_SITE_OTHER): Payer: BC Managed Care – PPO | Admitting: Internal Medicine

## 2023-03-24 VITALS — BP 140/90 | HR 70 | Ht 73.0 in | Wt 228.0 lb

## 2023-03-24 DIAGNOSIS — Z8719 Personal history of other diseases of the digestive system: Secondary | ICD-10-CM | POA: Diagnosis not present

## 2023-03-24 DIAGNOSIS — E7800 Pure hypercholesterolemia, unspecified: Secondary | ICD-10-CM | POA: Diagnosis not present

## 2023-03-24 DIAGNOSIS — I10 Essential (primary) hypertension: Secondary | ICD-10-CM | POA: Diagnosis not present

## 2023-03-24 DIAGNOSIS — Z0000 Encounter for general adult medical examination without abnormal findings: Secondary | ICD-10-CM

## 2023-03-24 DIAGNOSIS — Z23 Encounter for immunization: Secondary | ICD-10-CM

## 2023-03-24 DIAGNOSIS — Z860101 Personal history of adenomatous and serrated colon polyps: Secondary | ICD-10-CM | POA: Diagnosis not present

## 2023-03-24 DIAGNOSIS — J309 Allergic rhinitis, unspecified: Secondary | ICD-10-CM

## 2023-03-24 LAB — POCT URINALYSIS DIP (CLINITEK)
Bilirubin, UA: NEGATIVE
Blood, UA: NEGATIVE
Glucose, UA: NEGATIVE mg/dL
Ketones, POC UA: NEGATIVE mg/dL
Leukocytes, UA: NEGATIVE
Nitrite, UA: NEGATIVE
POC PROTEIN,UA: NEGATIVE
Spec Grav, UA: 1.01
Urobilinogen, UA: 0.2 U/dL
pH, UA: 7

## 2023-03-24 NOTE — Patient Instructions (Addendum)
It was a pleasure to see you today.  Blood pressure is elevated in the office and needs to be monitored at home.  Please let me know if it stays persistently elevated as we may need to add additional medication.  Labs are stable.  Try to watch diet and continue exercise regimen on a regular basis.  Return in 6 months or as needed.  Influenza vaccine given today.  We will check lipid panel when you return as LDL is elevated at 119.  We may need to increase simvastatin.

## 2023-05-20 ENCOUNTER — Other Ambulatory Visit: Payer: Self-pay | Admitting: Internal Medicine

## 2023-05-30 DIAGNOSIS — J34829 Nasal valve collapse, unspecified: Secondary | ICD-10-CM | POA: Diagnosis not present

## 2023-05-30 DIAGNOSIS — R0683 Snoring: Secondary | ICD-10-CM | POA: Diagnosis not present

## 2023-05-30 DIAGNOSIS — M26622 Arthralgia of left temporomandibular joint: Secondary | ICD-10-CM | POA: Diagnosis not present

## 2023-05-30 DIAGNOSIS — J342 Deviated nasal septum: Secondary | ICD-10-CM | POA: Diagnosis not present

## 2023-06-09 ENCOUNTER — Other Ambulatory Visit: Payer: Self-pay | Admitting: Internal Medicine

## 2023-07-03 ENCOUNTER — Other Ambulatory Visit: Payer: Self-pay | Admitting: Internal Medicine

## 2023-07-31 DIAGNOSIS — R0683 Snoring: Secondary | ICD-10-CM | POA: Diagnosis not present

## 2023-07-31 DIAGNOSIS — J0101 Acute recurrent maxillary sinusitis: Secondary | ICD-10-CM | POA: Diagnosis not present

## 2023-07-31 DIAGNOSIS — J342 Deviated nasal septum: Secondary | ICD-10-CM | POA: Diagnosis not present

## 2023-08-07 ENCOUNTER — Other Ambulatory Visit: Payer: Self-pay | Admitting: Internal Medicine

## 2023-09-23 ENCOUNTER — Other Ambulatory Visit: Payer: BC Managed Care – PPO

## 2023-09-23 DIAGNOSIS — E786 Lipoprotein deficiency: Secondary | ICD-10-CM

## 2023-09-23 DIAGNOSIS — I10 Essential (primary) hypertension: Secondary | ICD-10-CM | POA: Diagnosis not present

## 2023-09-23 DIAGNOSIS — Z125 Encounter for screening for malignant neoplasm of prostate: Secondary | ICD-10-CM | POA: Diagnosis not present

## 2023-09-23 DIAGNOSIS — E7800 Pure hypercholesterolemia, unspecified: Secondary | ICD-10-CM

## 2023-09-23 LAB — CBC WITH DIFFERENTIAL/PLATELET
Absolute Lymphocytes: 1463 {cells}/uL (ref 850–3900)
Absolute Monocytes: 466 {cells}/uL (ref 200–950)
Basophils Absolute: 53 {cells}/uL (ref 0–200)
Basophils Relative: 0.9 %
Eosinophils Absolute: 183 {cells}/uL (ref 15–500)
Eosinophils Relative: 3.1 %
HCT: 46.3 % (ref 38.5–50.0)
Hemoglobin: 16 g/dL (ref 13.2–17.1)
MCH: 30.9 pg (ref 27.0–33.0)
MCHC: 34.6 g/dL (ref 32.0–36.0)
MCV: 89.6 fL (ref 80.0–100.0)
MPV: 9.4 fL (ref 7.5–12.5)
Monocytes Relative: 7.9 %
Neutro Abs: 3735 {cells}/uL (ref 1500–7800)
Neutrophils Relative %: 63.3 %
Platelets: 224 10*3/uL (ref 140–400)
RBC: 5.17 10*6/uL (ref 4.20–5.80)
RDW: 13 % (ref 11.0–15.0)
Total Lymphocyte: 24.8 %
WBC: 5.9 10*3/uL (ref 3.8–10.8)

## 2023-09-23 LAB — COMPLETE METABOLIC PANEL WITHOUT GFR
AG Ratio: 1.8 (calc) (ref 1.0–2.5)
ALT: 38 U/L (ref 9–46)
AST: 35 U/L (ref 10–35)
Albumin: 4.3 g/dL (ref 3.6–5.1)
Alkaline phosphatase (APISO): 76 U/L (ref 35–144)
BUN/Creatinine Ratio: 8 (calc) (ref 6–22)
BUN: 6 mg/dL — ABNORMAL LOW (ref 7–25)
CO2: 29 mmol/L (ref 20–32)
Calcium: 9.2 mg/dL (ref 8.6–10.3)
Chloride: 100 mmol/L (ref 98–110)
Creat: 0.8 mg/dL (ref 0.70–1.35)
Globulin: 2.4 g/dL (ref 1.9–3.7)
Glucose, Bld: 101 mg/dL — ABNORMAL HIGH (ref 65–99)
Potassium: 4.4 mmol/L (ref 3.5–5.3)
Sodium: 137 mmol/L (ref 135–146)
Total Bilirubin: 0.8 mg/dL (ref 0.2–1.2)
Total Protein: 6.7 g/dL (ref 6.1–8.1)

## 2023-09-23 LAB — LIPID PANEL
Cholesterol: 207 mg/dL — ABNORMAL HIGH (ref <200)
HDL: 64 mg/dL (ref >=40)
LDL Cholesterol (Calc): 122 mg/dL — ABNORMAL HIGH
Non-HDL Cholesterol (Calc): 143 mg/dL — ABNORMAL HIGH (ref <130)
Total CHOL/HDL Ratio: 3.2 (calc) (ref <5.0)
Triglycerides: 103 mg/dL (ref <150)

## 2023-09-24 NOTE — Progress Notes (Signed)
 Patient Care Team: Sylvan Evener, MD as PCP - General (Internal Medicine)  Visit Date: 09/25/23  Subjective:   Chief Complaint  Patient presents with  . Follow-up   Vitals:   09/25/23 0938 09/25/23 0951  BP: (!) 148/90 (!) 140/100   Patient GN:FAOZ Slee,Male DOB:1961/03/05,63 y.o. HYQ:[id number]   63 y.o. Male presents today for 6 months follow-up for Hypertension; Pure Hypercholesterolemia. Seen here for his annual visit on 03/24/2023, in the interim has seen ENT physician, Dr. Donalee Fruits twice. Breathe right nasal strips have helped.Hx of chronic snoring and strips have helped some.Sleep study recommended by Dr. Donalee Fruits whom he saw in January.    History of Hypertension treated with Amlodipine  5 mg daily. Blood Pressure: hypertensive today at 148/90, on recheck 13 minutes later 140/100. Says that at-home his blood pressure tends to be 120s/80s.  History of Hyperlipidemia treated with  Simvastatin  10 mg daily. 09/23/2023 Lipid Panel, compared to 02/2023: Cholesterol 207, elevated from 200, and LDL 122, elevated from 119. Says that he eats a healthy diet overall.   Reviewed 09/23/2023 CBC: WNL.     History of GERD treated with Pantoprazole  40 mg daily.   History of Allergic Rhinitis treated with Claritin as needed.     Vaccine Counseling dicussed: due for PNA - he is agreeable to receiving this, Covid-19 - will postpone this until fall, and Shingrix.  Past Medical History:  Diagnosis Date  . Heart murmur   . Hyperlipidemia   . Hypertension   . Migraine headache   . Mitral regurgitation   . Vision abnormalities   . Vitamin D  deficiency     Allergies  Allergen Reactions  . Amoxicillin-Pot Clavulanate     REACTION: hives  . Augmentin [Amoxicillin-Pot Clavulanate] Rash    Family History  Problem Relation Age of Onset  . Cancer Father   . Colon cancer Neg Hx   . Colon polyps Neg Hx   . Esophageal cancer Neg Hx   . Rectal cancer Neg Hx   . Stomach cancer Neg Hx     Social History   Social History Narrative  . Not on file  Going to a wedding in November.  Review of Systems  All other systems reviewed and are negative.    Objective:  Vitals: BP (!) 148/90   Pulse 93   Temp (!) 97.4 F (36.3 C) (Temporal)   Ht 6' (1.829 m)   Wt 233 lb 6.4 oz (105.9 kg)   SpO2 97%   BMI 31.65 kg/m   Physical Exam Constitutional:      General: He is not in acute distress.    Appearance: Normal appearance. He is not ill-appearing.  HENT:     Head: Normocephalic and atraumatic.  Cardiovascular:     Rate and Rhythm: Normal rate and regular rhythm.     Pulses: Normal pulses.     Heart sounds: Normal heart sounds. No murmur heard.    No friction rub. No gallop.  Pulmonary:     Effort: Pulmonary effort is normal. No respiratory distress.     Breath sounds: Normal breath sounds. No wheezing or rales.  Skin:    General: Skin is warm and dry.  Neurological:     Mental Status: He is alert and oriented to person, place, and time. Mental status is at baseline.  Psychiatric:        Mood and Affect: Mood normal.        Behavior: Behavior normal.  Thought Content: Thought content normal.        Judgment: Judgment normal.    Results:  Studies Obtained And Personally Reviewed By Me: Labs:     Component Value Date/Time   NA 137 09/23/2023 0929   K 4.4 09/23/2023 0929   CL 100 09/23/2023 0929   CO2 29 09/23/2023 0929   GLUCOSE 101 (H) 09/23/2023 0929   BUN 6 (L) 09/23/2023 0929   CREATININE 0.80 09/23/2023 0929   CALCIUM 9.2 09/23/2023 0929   PROT 6.7 09/23/2023 0929   ALBUMIN 4.4 03/13/2018 0733   AST 35 09/23/2023 0929   ALT 38 09/23/2023 0929   ALKPHOS 59 03/13/2018 0733   BILITOT 0.8 09/23/2023 0929   GFRNONAA 101 03/14/2020 0956   GFRAA 117 03/14/2020 0956    Lab Results  Component Value Date   WBC 5.9 09/23/2023   HGB 16.0 09/23/2023   HCT 46.3 09/23/2023   MCV 89.6 09/23/2023   PLT 224 09/23/2023   Lab Results  Component Value  Date   CHOL 207 (H) 09/23/2023   HDL 64 09/23/2023   LDLCALC 122 (H) 09/23/2023   TRIG 103 09/23/2023   CHOLHDL 3.2 09/23/2023   Lab Results  Component Value Date   PSA 0.41 03/20/2023   PSA 0.71 03/15/2022   PSA 0.49 03/16/2021     Assessment & Plan:   Orders Placed This Encounter  Procedures  . Pneumococcal conjugate vaccine 20-valent   Hypertension treated with Amlodipine  5 mg daily. Blood Pressure: hypertensive today at 148/90, on recheck 13 minutes later 140/100. Says that at-home his blood pressure tends to be 120s/80s.  Hyperlipidemia treated with  Simvastatin  10 mg daily. 09/23/2023 Lipid Panel, compared to 02/2023: Cholesterol 207, elevated from 200, and LDL 122, elevated from 119. Says that he eats a healthy diet overall.   Reviewed 09/23/2023 CBC: WNL.    Depression treated with Escitalopram  10 mg daily.   GERD treated with Pantoprazole  40 mg daily.   Allergic Rhinitis treated with Claritin as needed.   Vaccine Counseling dicussed: due for PNA - he is agreeable to receiving this today, Covid-19 - will postpone this until fall, and Shingrix.   Return in 6 months (on 04/02/2024) for annual labs and then annual visit on 04/06/2024.    I,Emily Lagle,acting as a Neurosurgeon for Sylvan Evener, MD.,have documented all relevant documentation on the behalf of Sylvan Evener, MD,as directed by  Sylvan Evener, MD while in the presence of Sylvan Evener, MD.   ***

## 2023-09-25 ENCOUNTER — Ambulatory Visit: Payer: BC Managed Care – PPO | Admitting: Internal Medicine

## 2023-09-25 VITALS — BP 140/100 | HR 93 | Temp 97.4°F | Ht 72.0 in | Wt 233.4 lb

## 2023-09-25 DIAGNOSIS — J309 Allergic rhinitis, unspecified: Secondary | ICD-10-CM

## 2023-09-25 DIAGNOSIS — I10 Essential (primary) hypertension: Secondary | ICD-10-CM | POA: Diagnosis not present

## 2023-09-25 DIAGNOSIS — Z79899 Other long term (current) drug therapy: Secondary | ICD-10-CM | POA: Diagnosis not present

## 2023-09-25 DIAGNOSIS — Z8719 Personal history of other diseases of the digestive system: Secondary | ICD-10-CM

## 2023-09-25 DIAGNOSIS — E7800 Pure hypercholesterolemia, unspecified: Secondary | ICD-10-CM | POA: Diagnosis not present

## 2023-09-25 DIAGNOSIS — Z23 Encounter for immunization: Secondary | ICD-10-CM

## 2023-09-28 ENCOUNTER — Encounter: Payer: Self-pay | Admitting: Internal Medicine

## 2023-09-28 NOTE — Patient Instructions (Signed)
 Continue to monitor BP at home. Patient reports normal readings at home. Continue simvastatin . Could increase Simvastatin  to decrease LDL. Patient would like to wait and recheck in 6 months. Pneumococcal 20 vaccine given. CPE due in 6 months.

## 2023-10-14 ENCOUNTER — Encounter: Payer: Self-pay | Admitting: Primary Care

## 2023-10-14 ENCOUNTER — Ambulatory Visit (INDEPENDENT_AMBULATORY_CARE_PROVIDER_SITE_OTHER): Admitting: Primary Care

## 2023-10-14 VITALS — BP 144/80 | HR 95 | Ht 72.0 in | Wt 234.0 lb

## 2023-10-14 DIAGNOSIS — J019 Acute sinusitis, unspecified: Secondary | ICD-10-CM | POA: Diagnosis not present

## 2023-10-14 DIAGNOSIS — R0683 Snoring: Secondary | ICD-10-CM

## 2023-10-14 DIAGNOSIS — R011 Cardiac murmur, unspecified: Secondary | ICD-10-CM

## 2023-10-14 NOTE — Progress Notes (Signed)
 @Patient  ID: Joel Coffey, male    DOB: Apr 28, 1961, 63 y.o.   MRN: [id number]  Chief Complaint  Patient presents with   Follow-up    Sleep consult; no sleep study    Referring provider: Janita Mellow, MD  HPI: 63 year old male, ever smoked.  Past medical history significant for mitral valve regurgitation, allergic rhinitis, hyperlipidemia, vitamin D  deficiency.  10/14/2023 Discussed the use of AI scribe software for clinical note transcription with the patient, who gave verbal consent to proceed.  History of Present Illness   Joel Coffey is a 63 year old male who presents for a sleep consult. He was referred by Dr. Donalee Fruits for evaluation of suspected sleep apnea.  He was referred for a sleep study after recurrent sinus infections. His wife reports a history of snoring and episodes of waking up at night. He acknowledges snoring, especially when supine, but notes improvement when sleeping on his side. Sinus cones at night have been helpful, and elevating his head reduces snoring, though it returns if he slips down.  He has a history of a heart murmur since childhood. No history of cardiac arrhythmias, heart failure, COPD, asthma, seizures, or narcolepsy. He does not use oxygen and reports no significant daytime sleepiness, with an Epworth score of 2 out of 24. He typically goes to bed at 9:30 PM, falls asleep quickly, and wakes up around 6:45 AM, usually only waking once to use the bathroom. He sleeps soundly and does not experience frequent awakenings unless he is on his back and wakes himself up from snoring.  He mentions a history of weight gain, particularly during the COVID-19 pandemic, and recalls a time when he was more active and weighed less, which correlated with reduced snoring. He is currently working on weight loss.      Allergies  Allergen Reactions   Amoxicillin-Pot Clavulanate     REACTION: hives   Augmentin [Amoxicillin-Pot Clavulanate] Rash    Immunization History   Administered Date(s) Administered   Influenza Split 03/04/2011, 03/10/2012   Influenza, Seasonal, Injecte, Preservative Fre 03/24/2023   Influenza,inj,Quad PF,6+ Mos 03/16/2013, 03/10/2018, 03/19/2019, 03/09/2020, 03/01/2022   Influenza-Unspecified 03/08/2015, 03/01/2019, 03/05/2021   PFIZER(Purple Top)SARS-COV-2 Vaccination 07/30/2019, 08/20/2019, 05/05/2020   PNEUMOCOCCAL CONJUGATE-20 09/25/2023   Tdap 11/08/2005, 12/27/2016    Past Medical History:  Diagnosis Date   Heart murmur    Hyperlipidemia    Hypertension    Migraine headache    Mitral regurgitation    Vision abnormalities    Vitamin D  deficiency     Tobacco History: Social History   Tobacco Use  Smoking Status Never  Smokeless Tobacco Never   Counseling given: Not Answered   Outpatient Medications Prior to Visit  Medication Sig Dispense Refill   amLODipine  (NORVASC ) 5 MG tablet TAKE 1 TABLET(5 MG) BY MOUTH DAILY 90 tablet 1   escitalopram  (LEXAPRO ) 10 MG tablet TAKE 1 TABLET(10 MG) BY MOUTH DAILY 90 tablet 3   ibuprofen  (ADVIL ) 800 MG tablet TAKE 1 TABLET(800 MG) BY MOUTH EVERY 8 HOURS AS NEEDED FOR PAIN 90 tablet 3   loratadine (CLARITIN) 10 MG tablet Take 10 mg by mouth daily.     Multiple Vitamins-Minerals (MULTIVITAMIN,TX-MINERALS) tablet Take 1 tablet by mouth daily.     pantoprazole  (PROTONIX ) 40 MG tablet TAKE 1 TABLET(40 MG) BY MOUTH DAILY 90 tablet 0   simvastatin  (ZOCOR ) 10 MG tablet TAKE 1 TABLET(10 MG) BY MOUTH DAILY 90 tablet 3   No facility-administered medications prior to visit.   Review of  Systems  Review of Systems  Constitutional: Negative.  Negative for fatigue.  HENT:  Positive for congestion.   Respiratory:  Positive for apnea. Negative for shortness of breath and wheezing.   Cardiovascular: Negative.   Psychiatric/Behavioral:  Negative for sleep disturbance.     Physical Exam  BP (!) 144/80 (BP Location: Right Arm, Cuff Size: Normal)   Pulse 95   Ht 6' (1.829 m)   Wt 234 lb  (106.1 kg)   SpO2 98%   BMI 31.74 kg/m  Physical Exam Constitutional:      General: He is not in acute distress.    Appearance: Normal appearance. He is not ill-appearing.  HENT:     Head: Normocephalic and atraumatic.  Cardiovascular:     Rate and Rhythm: Normal rate and regular rhythm.     Heart sounds: Murmur heard.  Pulmonary:     Effort: Pulmonary effort is normal.     Breath sounds: Normal breath sounds. No wheezing or rhonchi.  Musculoskeletal:        General: Normal range of motion.  Skin:    General: Skin is warm and dry.  Neurological:     General: No focal deficit present.     Mental Status: He is alert and oriented to person, place, and time. Mental status is at baseline.  Psychiatric:        Mood and Affect: Mood normal.        Behavior: Behavior normal.        Thought Content: Thought content normal.        Judgment: Judgment normal.      Lab Results:  CBC    Component Value Date/Time   WBC 5.9 09/23/2023 0929   RBC 5.17 09/23/2023 0929   HGB 16.0 09/23/2023 0929   HCT 46.3 09/23/2023 0929   PLT 224 09/23/2023 0929   MCV 89.6 09/23/2023 0929   MCH 30.9 09/23/2023 0929   MCHC 34.6 09/23/2023 0929   RDW 13.0 09/23/2023 0929   LYMPHSABS 1,325 03/15/2022 0906   MONOABS 0.7 03/13/2018 0733   EOSABS 183 09/23/2023 0929   BASOSABS 53 09/23/2023 0929    BMET    Component Value Date/Time   NA 137 09/23/2023 0929   K 4.4 09/23/2023 0929   CL 100 09/23/2023 0929   CO2 29 09/23/2023 0929   GLUCOSE 101 (H) 09/23/2023 0929   BUN 6 (L) 09/23/2023 0929   CREATININE 0.80 09/23/2023 0929   CALCIUM 9.2 09/23/2023 0929   GFRNONAA 101 03/14/2020 0956   GFRAA 117 03/14/2020 0956    BNP No results found for: "BNP"  ProBNP No results found for: "PROBNP"  Imaging: No results found.   Assessment & Plan:   1. Loud snoring (Primary) - Home sleep test; Future  Assessment and Plan    Suspected Sleep Apnea Suspected obstructive sleep apnea due to  snoring and possible apneas during sleep, as reported by his wife. No significant daytime somnolence. Discussed pathophysiology, including airway obstruction by the base of the tongue, especially in the supine position. Explained risks of untreated sleep apnea, such as cardiac arrhythmias, stroke, diabetes, pulmonary hypertension, and potential links to Alzheimer's. Discussed treatment options: CPAP, oral appliances, and positional therapy. He prefers not to use CPAP and is pursuing weight loss as a management strategy. Discussed cost and insurance coverage issues of oral appliances. Discussed Inspire device as a surgical option for moderate to severe cases, requiring prior CPAP trial and BMI less than 40. - Order home sleep  study through [facility] - Provide information about sleep apnea and home sleep study - Recommend wedge pillow for positional therapy - Discuss potential referral to ENT for surgical options if needed  Allergic Rhinitis Allergic rhinitis contributing to sinus issues. 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 Sinus cones have been helpful for nighttime valve collapse. - Continue use of sinus cones for nighttime valve collapse  Heart Murmur Heart murmur since childhood. No history of cardiac arrhythmias or heart failure.  Antonio Baumgarten, NP 10/14/2023

## 2023-10-14 NOTE — Patient Instructions (Signed)
 -SUSPECTED SLEEP APNEA: Sleep apnea is a condition where your breathing stops and starts during sleep due to airway obstruction. We discussed the risks of untreated sleep apnea, including heart problems and other serious health issues. You prefer not to use a CPAP machine and are focusing on weight loss. We will order a home sleep study to confirm the diagnosis. In the meantime, using a wedge pillow may help reduce snoring by keeping you in a better position while sleeping. If needed, we can discuss surgical options with an ENT specialist in the future.  -ALLERGIC RHINITIS: Allergic rhinitis is an allergic reaction that causes sneezing, congestion, and sinus issues. You should continue using sinus cones at night to help with nasal valve collapse.  INSTRUCTIONS: We will order a home sleep study through [facility] to confirm if you have sleep apnea. Please continue using sinus cones at night for your allergic rhinitis. If the sleep study confirms sleep apnea and you need further treatment, we may refer you to an ENT specialist for surgical options. Continue your efforts with weight loss as it may help reduce your symptoms.  Follow-up Call or send mychart message 2-3 weeks after completing sleep study for results       Sleep Apnea  Sleep apnea is a condition that affects your breathing while you are sleeping. Your tongue or soft tissue in your throat may block the flow of air while you sleep. You may have shallow breathing or stop breathing for short periods of time. People with sleep apnea may snore loudly. There are three kinds of sleep apnea: Obstructive sleep apnea. This kind is caused by a blocked or collapsed airway. This is the most common. Central sleep apnea. This kind happens when the part of the brain that controls breathing does not send the correct signals to the muscles that control breathing. Mixed sleep apnea. This is a combination of obstructive and central sleep  apnea. What are the causes? The most common cause of sleep apnea is a collapsed or blocked airway. What increases the risk? Being very overweight. Having family members with sleep apnea. Having a tongue or tonsils that are larger than normal. Having a small airway or jaw problems. Being older. What are the signs or symptoms? Loud snoring. Restless sleep. Trouble staying asleep. Being sleepy or tired during the day. Waking up gasping or choking. Having a headache in the morning. Mood swings. Having a hard time remembering things and concentrating. How is this diagnosed? A medical history. A physical exam. A sleep study. This is also called a polysomnography test. This test is done at a sleep lab or in your home while you are sleeping. How is this treated? Treatment may include: Sleeping on your side. Losing weight if you're overweight. Wearing an oral appliance. This is a mouthpiece that moves your lower jaw forward. Using a positive airway pressure (PAP) device to keep your airways open while you sleep, such as: A continuous positive airway pressure (CPAP) device. This device gives forced air through a mask when you breathe out. This keeps your airways open. A bilevel positive airway pressure (BIPAP) device. This device gives forced air through a mask when you breathe in and when you breathe out to keep your airways open. Having surgery if other treatments do not work. If your sleep apnea is not treated, you may be at risk for: Heart failure. Heart attack. Stroke. Type 2 diabetes or a problem with your blood sugar called insulin resistance. Follow these instructions  at home: Medicines Take your medicines only as told by your health care provider. Avoid alcohol, medicines to help you relax, and certain pain medicines. These may make sleep apnea worse. General instructions Do not smoke, vape, or use products with nicotine or tobacco in them. If you need help quitting, talk with  your provider. If you were given a PAP device to open your airway while you sleep, use it as told by your provider. If you're having surgery, make sure to tell your provider you have sleep apnea. You may need to bring your PAP device with you. Contact a health care provider if: The PAP device that you were given to use during sleep bothers you or does not seem to be working. You do not feel better or you feel worse. Get help right away if: You have trouble breathing. You have chest pain. You have trouble talking. One side of your body feels weak. A part of your face is hanging down. These symptoms may be an emergency. Call 911 right away. Do not wait to see if the symptoms will go away. Do not drive yourself to the hospital. This information is not intended to replace advice given to you by your health care provider. Make sure you discuss any questions you have with your health care provider. Document Revised: 02/13/2023 Document Reviewed: 07/18/2022 Elsevier Patient Education  2024 [employer].

## 2023-11-04 ENCOUNTER — Encounter

## 2023-11-04 DIAGNOSIS — R0683 Snoring: Secondary | ICD-10-CM

## 2023-11-04 DIAGNOSIS — G4730 Sleep apnea, unspecified: Secondary | ICD-10-CM | POA: Diagnosis not present

## 2023-11-11 ENCOUNTER — Other Ambulatory Visit: Payer: Self-pay | Admitting: Internal Medicine

## 2023-11-14 ENCOUNTER — Telehealth: Payer: Self-pay | Admitting: Pulmonary Disease

## 2023-11-14 DIAGNOSIS — G4733 Obstructive sleep apnea (adult) (pediatric): Secondary | ICD-10-CM | POA: Diagnosis not present

## 2023-11-14 NOTE — Telephone Encounter (Signed)
 ATC X1 LVM for patient to call our office back regarding sleep study. Will post result's on mychart .

## 2023-11-14 NOTE — Telephone Encounter (Signed)
 Call patient  Sleep study result  Date of study: 11/04/2023  Impression: Mild obstructive sleep apnea with AHI of 9.7, O2 nadir of 83%  Recommendation: Options of treatment for mild obstructive sleep apnea will include  1.  CPAP therapy if there is significant daytime sleepiness or other comorbidities including history of CVA or cardiac disease -If CPAP is chosen as an option of treatment auto titrating CPAP with a pressure setting of 5-15 will be appropriate  2.  Watchful waiting with emphasis on weight loss measures, sleep position modification to optimize lateral sleep, elevating the head of the bed by about 30 degrees may also help.  3.  An oral device may be fashioned for the treatment of mild sleep disordered breathing, will involve referral to dentist.   Follow-up as previously scheduled

## 2023-11-17 ENCOUNTER — Telehealth: Payer: Self-pay | Admitting: Internal Medicine

## 2023-11-17 ENCOUNTER — Encounter: Payer: Self-pay | Admitting: Internal Medicine

## 2023-11-17 MED ORDER — SIMVASTATIN 20 MG PO TABS: 20.0000 mg | ORAL_TABLET | Freq: Every day | ORAL | 3 refills | Status: AC

## 2023-11-17 NOTE — Telephone Encounter (Signed)
 Sending in new Rx for simvastatin  20 mg daily with refills to [facility]. MJB, MD

## 2023-11-17 NOTE — Telephone Encounter (Signed)
 Left voicemail letting patient know this medication was sent in to pharmacy as requested.

## 2023-12-28 ENCOUNTER — Other Ambulatory Visit: Payer: Self-pay | Admitting: Internal Medicine

## 2024-02-13 ENCOUNTER — Other Ambulatory Visit: Payer: Self-pay | Admitting: Internal Medicine

## 2024-04-02 ENCOUNTER — Other Ambulatory Visit: Payer: Self-pay

## 2024-04-02 DIAGNOSIS — I10 Essential (primary) hypertension: Secondary | ICD-10-CM

## 2024-04-02 DIAGNOSIS — R7309 Other abnormal glucose: Secondary | ICD-10-CM | POA: Diagnosis not present

## 2024-04-02 DIAGNOSIS — E7800 Pure hypercholesterolemia, unspecified: Secondary | ICD-10-CM | POA: Diagnosis not present

## 2024-04-02 DIAGNOSIS — Z0000 Encounter for general adult medical examination without abnormal findings: Secondary | ICD-10-CM

## 2024-04-03 LAB — CBC WITH DIFFERENTIAL/PLATELET
Absolute Lymphocytes: 1467 {cells}/uL (ref 850–3900)
Absolute Monocytes: 435 {cells}/uL (ref 200–950)
Basophils Absolute: 58 {cells}/uL (ref 0–200)
Basophils Relative: 1 %
Eosinophils Absolute: 168 {cells}/uL (ref 15–500)
Eosinophils Relative: 2.9 %
HCT: 48.2 % (ref 38.5–50.0)
Hemoglobin: 16.2 g/dL (ref 13.2–17.1)
MCH: 30.2 pg (ref 27.0–33.0)
MCHC: 33.6 g/dL (ref 32.0–36.0)
MCV: 89.8 fL (ref 80.0–100.0)
MPV: 9.6 fL (ref 7.5–12.5)
Monocytes Relative: 7.5 %
Neutro Abs: 3671 {cells}/uL (ref 1500–7800)
Neutrophils Relative %: 63.3 %
Platelets: 236 Thousand/uL (ref 140–400)
RBC: 5.37 Million/uL (ref 4.20–5.80)
RDW: 12.9 % (ref 11.0–15.0)
Total Lymphocyte: 25.3 %
WBC: 5.8 Thousand/uL (ref 3.8–10.8)

## 2024-04-03 LAB — LIPID PANEL
Cholesterol: 168 mg/dL (ref <200)
HDL: 64 mg/dL (ref >=40)
LDL Cholesterol (Calc): 89 mg/dL
Non-HDL Cholesterol (Calc): 104 mg/dL (ref <130)
Total CHOL/HDL Ratio: 2.6 (calc) (ref <5.0)
Triglycerides: 64 mg/dL (ref <150)

## 2024-04-03 LAB — COMPREHENSIVE METABOLIC PANEL WITH GFR
AG Ratio: 1.7 (calc) (ref 1.0–2.5)
ALT: 39 U/L (ref 9–46)
AST: 30 U/L (ref 10–35)
Albumin: 4.3 g/dL (ref 3.6–5.1)
Alkaline phosphatase (APISO): 73 U/L (ref 35–144)
BUN/Creatinine Ratio: 12 (calc) (ref 6–22)
BUN: 8 mg/dL (ref 7–25)
CO2: 27 mmol/L (ref 20–32)
Calcium: 9.2 mg/dL (ref 8.6–10.3)
Chloride: 103 mmol/L (ref 98–110)
Creat: 0.65 mg/dL — ABNORMAL LOW (ref 0.70–1.35)
Globulin: 2.6 g/dL (ref 1.9–3.7)
Glucose, Bld: 96 mg/dL (ref 65–99)
Potassium: 4.2 mmol/L (ref 3.5–5.3)
Sodium: 138 mmol/L (ref 135–146)
Total Bilirubin: 0.5 mg/dL (ref 0.2–1.2)
Total Protein: 6.9 g/dL (ref 6.1–8.1)
eGFR: 105 mL/min/1.73m2 (ref >=60)

## 2024-04-03 LAB — HEMOGLOBIN A1C
Hgb A1c MFr Bld: 5.6 % (ref <5.7)
Mean Plasma Glucose: 114 mg/dL
eAG (mmol/L): 6.3 mmol/L

## 2024-04-03 LAB — PSA: PSA: 0.46 ng/mL (ref <=4.00)

## 2024-04-05 NOTE — Progress Notes (Shared)
 Annual Comprehensive Physical Exam   Patient Care Team: Perri Ronal PARAS, MD as PCP - General (Internal Medicine)  Visit Date: 04/06/24   Chief Complaint  Patient presents with   Annual Exam   Subjective:  Patient: Joel Coffey, Male DOB: 06-21-1960, 63 y.o. MRN: [id number] Vitals:   04/06/24 1456  BP: 130/80   Joel Coffey is a 63 y.o. Male who presents today for his Annual Comprehensive Physical Exam. Patient has Hyperlipidemia; Allergic rhinitis; Vitamin D  deficiency; Headache, classical migraine; and Mitral regurgitation on their problem list.   He said that he has been using the Barnard med sinus rinse and that it has greatly improved his allergies.      He has complaints of low libido and had expressed interest in getting his testosterone level tested.  History of hyperlipidemia treated with simvastatin  10 mg daily.  04/02/2024 Lipid panel normal.   History of hypertension treated with amlodipine  5 mg daily. Blood pressure elevated in-office today 130/80.   History of depression treated with escitalopram  10 mg daily.   History of GE Reflux treated with pantoprazole  40 mg daily.   History of bleeding hemorrhoids.  Has used over-the-counter preparations. Takes Claritin as needed for allergic rhinitis symptoms.   History of migraine headaches but seldom has them anymore.  History of mild mitral regurgitation but is asymptomatic.     History of scrotal pain in 2004 treated for presumed epididymitis.   At one point, there was a question of Augmentin allergy but he went to an allergist and had formal testing for penicillin allergy.  He was found not to have penicillin allergy.   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  He recovered quickly.  He saw Dr. Jenel, neurologist who found no focal deficits.  He has not had recurrence.   History of cardiac murmur dating back to the 1990s.  Has been told he does not need SBE prophylaxis.  He saw Dr. Edith in 2007.  2D  echocardiogram showed trivial mitral valve regurgitation.  No evidence of mitral stenosis or mitral valve prolapse.  Overall left ventricular systolic function was normal.   Additional past medical history: Vasectomy 2002.  Allergy testing done in 2009 showed very strong reactions to multiple grasses and tree pollens.  He had mild reactivity to selected molds, dust mite, cat dander and cockroach.  Labs 04/02/2024 Creatinine 0.65, Otherwise WNL    02/02/2024 Colonoscopy Three 2 to 4 mm polyps in the descending colon, in the transverse colon and in the ascending colon. Resected and retrieved. Pathology found to be adenomatous. Diverticulosis in the sigmoid colon. The examination was otherwise normal on direct and retroflexion views.   Vaccine counseling: Shingles vaccine due.  Health Maintenance  Topic Date Due   Zoster Vaccines- Shingrix (1 of 2) Never done   COVID-19 Vaccine (8 - Pfizer risk 2025-26 season) 08/11/2024   Colonoscopy  02/01/2025   DTaP/Tdap/Td (3 - Td or Tdap) 12/28/2026   Pneumococcal Vaccine: 50+ Years  Completed   Influenza Vaccine  Completed   Hepatitis B Vaccines 19-59 Average Risk  Aged Out   Meningococcal B Vaccine  Aged Out   Hepatitis C Screening  Discontinued   HIV Screening  Discontinued       Review of Systems  Constitutional:  Negative for fever and malaise/fatigue.  HENT:  Negative for congestion.   Eyes:  Negative for blurred vision.  Respiratory:  Negative for cough and shortness of breath.   Cardiovascular:  Negative for chest  pain, palpitations and leg swelling.  Gastrointestinal:  Negative for vomiting.  Musculoskeletal:  Negative for back pain.  Skin:  Negative for rash.  Neurological:  Negative for loss of consciousness and headaches.   Objective:  Vitals: body mass index is 31.33 kg/m. Today's Vitals   04/06/24 1456  BP: 130/80  Pulse: 88  SpO2: 96%  Weight: 231 lb (104.8 kg)  Height: 6' (1.829 m)  PainSc: 0-No pain   Physical  Exam Vitals and nursing note reviewed. Exam conducted with a chaperone present (Araceli Schleswig, CMA).  Constitutional:      General: He is awake. He is not in acute distress.    Appearance: Normal appearance. He is not ill-appearing or toxic-appearing.  HENT:     Head: Normocephalic and atraumatic.     Right Ear: Tympanic membrane, ear canal and external ear normal.     Left Ear: Tympanic membrane, ear canal and external ear normal.     Mouth/Throat:     Pharynx: Oropharynx is clear.  Eyes:     Extraocular Movements: Extraocular movements intact.     Pupils: Pupils are equal, round, and reactive to light.  Neck:     Thyroid: No thyroid mass, thyromegaly or thyroid tenderness.     Vascular: No carotid bruit.  Cardiovascular:     Rate and Rhythm: Normal rate and regular rhythm. No extrasystoles are present.    Pulses:          Dorsalis pedis pulses are 2+ on the right side and 2+ on the left side.       Posterior tibial pulses are 2+ on the right side and 2+ on the left side.     Heart sounds: Normal heart sounds. No murmur heard.    No friction rub. No gallop.  Pulmonary:     Effort: Pulmonary effort is normal.     Breath sounds: Normal breath sounds. No decreased breath sounds, wheezing, rhonchi or rales.  Chest:     Chest wall: No mass.  Abdominal:     Palpations: Abdomen is soft. There is no hepatomegaly, splenomegaly or mass.     Tenderness: There is no abdominal tenderness.     Hernia: No hernia is present.  Genitourinary:    Prostate: Normal. Not enlarged, not tender and no nodules present.     Rectum: Normal. Guaiac result negative.  Musculoskeletal:     Cervical back: Normal range of motion.     Right lower leg: No edema.     Left lower leg: No edema.  Lymphadenopathy:     Cervical: No cervical adenopathy.     Upper Body:     Right upper body: No supraclavicular adenopathy.     Left upper body: No supraclavicular adenopathy.  Skin:    General: Skin is warm and  dry.  Neurological:     General: No focal deficit present.     Mental Status: He is alert and oriented to person, place, and time. Mental status is at baseline.     Cranial Nerves: Cranial nerves 2-12 are intact.     Sensory: Sensation is intact.     Motor: Motor function is intact.     Coordination: Coordination is intact.     Gait: Gait is intact.     Deep Tendon Reflexes: Reflexes are normal and symmetric.  Psychiatric:        Attention and Perception: Attention normal.        Mood and Affect: Mood normal.  Speech: Speech normal.        Behavior: Behavior normal. Behavior is cooperative.        Thought Content: Thought content normal.        Cognition and Memory: Cognition and memory normal.        Judgment: Judgment normal.     Current Outpatient Medications  Medication Instructions   amLODipine  (NORVASC ) 5 MG tablet TAKE 1 TABLET(5 MG) BY MOUTH DAILY   escitalopram  (LEXAPRO ) 10 MG tablet TAKE 1 TABLET(10 MG) BY MOUTH DAILY   ibuprofen  (ADVIL ) 800 MG tablet TAKE 1 TABLET(800 MG) BY MOUTH EVERY 8 HOURS AS NEEDED FOR PAIN   loratadine (CLARITIN) 10 mg, Daily   Multiple Vitamins-Minerals (MULTIVITAMIN,TX-MINERALS) tablet 1 tablet, Daily   pantoprazole  (PROTONIX ) 40 MG tablet TAKE 1 TABLET(40 MG) BY MOUTH DAILY   simvastatin  (ZOCOR ) 20 mg, Oral, Daily at bedtime   Past Medical History:  Diagnosis Date   Heart murmur    Hyperlipidemia    Hypertension    Migraine headache    Mitral regurgitation    Vision abnormalities    Vitamin D  deficiency    Medical/Surgical History Narrative:  Allergic/Intolerant to:  Allergies  Allergen Reactions   Amoxicillin-Pot Clavulanate     REACTION: hives   Augmentin [Amoxicillin-Pot Clavulanate] Rash    Past Surgical History:  Procedure Laterality Date   COLONOSCOPY  09/11/2009   normal exam-Dr.Gessner   HERNIA REPAIR     VASECTOMY     Family History  Problem Relation Age of Onset   Cancer Father    Colon cancer Neg Hx     Colon polyps Neg Hx    Esophageal cancer Neg Hx    Rectal cancer Neg Hx    Stomach cancer Neg Hx    Family History Narrative: Father died at age 32 of lymphoma. Mother died in November 20, 2017at age 26 with complications of dementia. 1 brother died of ruptured esophagus. 3 brothers. No sisters.   Social history: He is married. 2 adult children, a son and a daughter. He is a Cytogeneticist. Wife is a Sports Administrator. Patient is a non-smoker. Social alcohol consumption.    Most Recent Health Risks Assessment:   Most Recent Social Determinants of Health (Including Hx of Tobacco, Alcohol, and Drug Use) SDOH Screenings   Food Insecurity: Low Risk  (02/17/2023)   Received from [facility]  Housing: Low Risk  (02/17/2023)   Received from [facility]  Transportation Needs: No Transportation Needs (02/17/2023)   Received from [facility]  Utilities: Low Risk  (02/17/2023)   Received from [facility]  Alcohol Screen: Low Risk  (09/25/2023)  Depression (PHQ2-9): Low Risk  (04/06/2024)  Physical Activity: Sufficiently Active (09/25/2023)  Tobacco Use: Low Risk  (04/06/2024)   Social History   Tobacco Use   Smoking status: Never   Smokeless tobacco: Never  Vaping Use   Vaping status: Never Used  Substance Use Topics   Alcohol use: Yes    Alcohol/week: 15.0 standard drinks of alcohol    Types: 15 Cans of beer per week    Comment: beer   Drug use: No    Most Recent Fall Risk Assessment:    10/14/2023    8:46 AM  Fall Risk   Falls in the past year? 0   Most Recent Anxiety/Depression Screenings:    04/06/2024    3:02 PM 09/25/2023    9:38 AM  PHQ 2/9 Scores  PHQ - 2 Score 0 0  Most Recent Cognitive Screening:  Results:  Studies Obtained And Personally Reviewed By Me:  02/02/2024 Colonoscopy Three 2 to 4 mm polyps in the descending colon, in the transverse colon and in the ascending colon. Resected and retrieved. Pathology found to be adenomatous.  Diverticulosis in the sigmoid colon. The examination was otherwise normal on direct and retroflexion views.   Labs:  CBC w/ Differential Lab Results  Component Value Date   WBC 5.8 04/02/2024   RBC 5.37 04/02/2024   HGB 16.2 04/02/2024   HCT 48.2 04/02/2024   PLT 236 04/02/2024   MCV 89.8 04/02/2024   MCH 30.2 04/02/2024   MCHC 33.6 04/02/2024   RDW 12.9 04/02/2024   MPV 9.6 04/02/2024   LYMPHSABS 1,325 03/15/2022   MONOABS 0.7 03/13/2018   BASOSABS 58 04/02/2024    Comprehensive Metabolic Panel Lab Results  Component Value Date   NA 138 04/02/2024   K 4.2 04/02/2024   CL 103 04/02/2024   CO2 27 04/02/2024   GLUCOSE 96 04/02/2024   BUN 8 04/02/2024   CREATININE 0.65 (L) 04/02/2024   CALCIUM 9.2 04/02/2024   PROT 6.9 04/02/2024   ALBUMIN 4.4 03/13/2018   AST 30 04/02/2024   ALT 39 04/02/2024   ALKPHOS 59 03/13/2018   BILITOT 0.5 04/02/2024   EGFR 105 04/02/2024   GFRNONAA 101 03/14/2020   Lipid Panel  Lab Results  Component Value Date   CHOL 168 04/02/2024   HDL 64 04/02/2024   LDLCALC 89 04/02/2024   TRIG 64 04/02/2024   A1c Lab Results  Component Value Date   HGBA1C 5.6 04/02/2024    04/02/2024 PSA 0.46 Assessment & Plan:   Orders Placed This Encounter  Procedures   Testosterone   POCT URINALYSIS DIP (CLINITEK)   Meds ordered this encounter  Medications   ibuprofen  (ADVIL ) 800 MG tablet    Sig: TAKE 1 TABLET(800 MG) BY MOUTH EVERY 8 HOURS AS NEEDED FOR PAIN    Dispense:  90 tablet    Refill:  3    He has complaints of low libido and had expressed interest in getting his testosterone level tested.  Testosterone test ordered.    Hyperlipidemia: treated with simvastatin  10 mg daily.  04/02/2024 Lipid panel normal.   Hypertension: treated with amlodipine  5 mg daily. Blood pressure elevated in-office today 130/80.   Depression: treated with escitalopram  10 mg daily.   GE Reflux: treated with pantoprazole  40 mg daily.   02/02/2024 Colonoscopy  Three 2 to 4 mm polyps in the descending colon, in the transverse colon and in the ascending colon. Resected and retrieved. Pathology found to be adenomatous. Diverticulosis in the sigmoid colon. The examination was otherwise normal on direct and retroflexion views.   Vaccine counseling: Shingles vaccine due.     Annual Comprehensive Physical Exam done today including the all of the following: Reviewed patient's Family Medical History Reviewed patient's SDOH and reviewed tobacco, alcohol, and drug use.  Reviewed and updated list of patient's medical providers Assessment of cognitive impairment was done Assessed patient's functional ability Established a written schedule for health screening services Health Risk Assessent Completed and Reviewed  Discussed health benefits of physical activity, and encouraged him to engage in regular exercise appropriate for his age and condition.    I,Makayla C Reid,acting as a scribe for Ronal JINNY Hailstone, MD.,have documented all relevant documentation on the behalf of Ronal JINNY Hailstone, MD,as directed by  Ronal JINNY Hailstone, MD while in the presence of Ronal JINNY Hailstone, MD.  IRonal JINNY Hailstone, MD, have reviewed all documentation for and agree with the above Annual Wellness Visit documentation.  Ronal JINNY Hailstone, MD Internal Medicine 04/06/2024

## 2024-04-06 ENCOUNTER — Encounter: Payer: Self-pay | Admitting: Internal Medicine

## 2024-04-06 ENCOUNTER — Ambulatory Visit: Payer: Self-pay | Admitting: Internal Medicine

## 2024-04-06 VITALS — BP 130/80 | HR 88 | Ht 72.0 in | Wt 231.0 lb

## 2024-04-06 DIAGNOSIS — I10 Essential (primary) hypertension: Secondary | ICD-10-CM

## 2024-04-06 DIAGNOSIS — Z8719 Personal history of other diseases of the digestive system: Secondary | ICD-10-CM

## 2024-04-06 DIAGNOSIS — E7800 Pure hypercholesterolemia, unspecified: Secondary | ICD-10-CM

## 2024-04-06 DIAGNOSIS — Z0000 Encounter for general adult medical examination without abnormal findings: Secondary | ICD-10-CM | POA: Diagnosis not present

## 2024-04-06 DIAGNOSIS — R6882 Decreased libido: Secondary | ICD-10-CM

## 2024-04-06 DIAGNOSIS — J309 Allergic rhinitis, unspecified: Secondary | ICD-10-CM

## 2024-04-06 DIAGNOSIS — K219 Gastro-esophageal reflux disease without esophagitis: Secondary | ICD-10-CM

## 2024-04-06 DIAGNOSIS — Z1211 Encounter for screening for malignant neoplasm of colon: Secondary | ICD-10-CM

## 2024-04-06 DIAGNOSIS — F32A Depression, unspecified: Secondary | ICD-10-CM

## 2024-04-06 DIAGNOSIS — Z860101 Personal history of adenomatous and serrated colon polyps: Secondary | ICD-10-CM

## 2024-04-06 DIAGNOSIS — E785 Hyperlipidemia, unspecified: Secondary | ICD-10-CM

## 2024-04-06 DIAGNOSIS — R7989 Other specified abnormal findings of blood chemistry: Secondary | ICD-10-CM

## 2024-04-06 LAB — HEMOCCULT GUIAC POC 1CARD (OFFICE): Fecal Occult Blood, POC: NEGATIVE

## 2024-04-06 LAB — POCT URINALYSIS DIP (CLINITEK)
Bilirubin, UA: NEGATIVE
Blood, UA: NEGATIVE
Glucose, UA: NEGATIVE mg/dL
Ketones, POC UA: NEGATIVE mg/dL
Leukocytes, UA: NEGATIVE
Nitrite, UA: NEGATIVE
POC PROTEIN,UA: NEGATIVE
Spec Grav, UA: 1.015 (ref 1.010–1.025)
Urobilinogen, UA: 0.2 U/dL
pH, UA: 6 (ref 5.0–8.0)

## 2024-04-06 MED ORDER — IBUPROFEN 800 MG PO TABS: ORAL_TABLET | ORAL | 3 refills | Status: AC

## 2024-04-06 NOTE — Patient Instructions (Signed)
 Testosterone level checked at patient request.

## 2024-04-07 ENCOUNTER — Other Ambulatory Visit: Payer: Self-pay

## 2024-04-07 ENCOUNTER — Ambulatory Visit: Payer: Self-pay | Admitting: Internal Medicine

## 2024-04-07 DIAGNOSIS — R7989 Other specified abnormal findings of blood chemistry: Secondary | ICD-10-CM

## 2024-04-07 DIAGNOSIS — R6882 Decreased libido: Secondary | ICD-10-CM

## 2024-04-07 LAB — TESTOSTERONE: Testosterone: 266 ng/dL (ref 250–827)

## 2024-06-29 ENCOUNTER — Other Ambulatory Visit: Payer: Self-pay | Admitting: Internal Medicine

## 2025-04-11 ENCOUNTER — Other Ambulatory Visit

## 2025-04-14 ENCOUNTER — Encounter: Admitting: Internal Medicine
